# Patient Record
Sex: Male | Born: 1960
Health system: Southern US, Community
[De-identification: ages and names within clinical notes are randomized; demographics above are authoritative.]

## PROBLEM LIST (undated history)

## (undated) DIAGNOSIS — M1990 Unspecified osteoarthritis, unspecified site: Secondary | ICD-10-CM

## (undated) DIAGNOSIS — K219 Gastro-esophageal reflux disease without esophagitis: Secondary | ICD-10-CM

## (undated) DIAGNOSIS — I10 Essential (primary) hypertension: Secondary | ICD-10-CM

## (undated) DIAGNOSIS — E119 Type 2 diabetes mellitus without complications: Secondary | ICD-10-CM

## (undated) DIAGNOSIS — S2239XA Fracture of one rib, unspecified side, initial encounter for closed fracture: Secondary | ICD-10-CM

## (undated) HISTORY — PX: TONSILLECTOMY: SUR1361

## (undated) HISTORY — PX: JOINT REPLACEMENT: SHX530

## (undated) HISTORY — PX: KNEE ARTHROSCOPY: SHX127

## (undated) HISTORY — PX: BACK SURGERY: SHX140

## (undated) HISTORY — PX: OTHER SURGICAL HISTORY: SHX169

---

## 2010-04-25 ENCOUNTER — Ambulatory Visit: Payer: Self-pay | Admitting: Gastroenterology

## 2010-04-26 LAB — PATHOLOGY REPORT

## 2011-03-26 ENCOUNTER — Ambulatory Visit: Payer: Self-pay | Admitting: Orthopedic Surgery

## 2011-04-16 ENCOUNTER — Ambulatory Visit: Payer: Self-pay | Admitting: Orthopedic Surgery

## 2011-04-19 ENCOUNTER — Ambulatory Visit: Payer: Self-pay | Admitting: Orthopedic Surgery

## 2014-09-20 ENCOUNTER — Ambulatory Visit: Payer: Self-pay

## 2014-11-17 ENCOUNTER — Ambulatory Visit: Payer: Self-pay | Admitting: Family Medicine

## 2015-07-18 ENCOUNTER — Other Ambulatory Visit: Payer: Self-pay | Admitting: Family Medicine

## 2015-10-03 ENCOUNTER — Ambulatory Visit
Admission: EM | Admit: 2015-10-03 | Discharge: 2015-10-03 | Disposition: A | Discharge status: Home / Self Care | Payer: 59 | Attending: Family Medicine | Admitting: Family Medicine

## 2015-10-03 ENCOUNTER — Encounter: Payer: Self-pay | Admitting: Emergency Medicine

## 2015-10-03 DIAGNOSIS — J0110 Acute frontal sinusitis, unspecified: Secondary | ICD-10-CM

## 2015-10-03 DIAGNOSIS — R030 Elevated blood-pressure reading, without diagnosis of hypertension: Secondary | ICD-10-CM

## 2015-10-03 DIAGNOSIS — IMO0001 Reserved for inherently not codable concepts without codable children: Secondary | ICD-10-CM

## 2015-10-03 MED ORDER — AZITHROMYCIN 250 MG PO TABS: ORAL_TABLET | ORAL | Status: DC

## 2015-10-03 NOTE — ED Provider Notes (Signed)
CSN: [id number]     Arrival date & time 10/03/15  0854 History   First MD Initiated Contact with Patient 10/03/15 [phone]     Chief Complaint  Patient presents with  . Facial Pain  . Nasal Congestion   (Consider location/radiation/quality/duration/timing/severity/associated sxs/prior Treatment) Patient is a 55 y.o. male presenting with URI. The history is provided by the patient.  URI Presenting symptoms: congestion, cough, facial pain and fatigue   Severity:  Moderate Onset quality:  Sudden Duration:  4 days Timing:  Constant Progression:  Worsening Chronicity:  New Worsened by:  Nothing tried Ineffective treatments:  None tried Associated symptoms: headaches and sinus pain   Associated symptoms: no wheezing     History reviewed. No pertinent past medical history. History reviewed. No pertinent past surgical history. History reviewed. No pertinent family history. Social History  Substance Use Topics  . Smoking status: Never Smoker   . Smokeless tobacco: None  . Alcohol Use: Yes    Review of Systems  Constitutional: Positive for fatigue.  HENT: Positive for congestion.   Respiratory: Positive for cough. Negative for wheezing.   Neurological: Positive for headaches.    Allergies  Penicillins  Home Medications   Prior to Admission medications   Medication Sig Start Date End Date Taking? Authorizing Provider  azithromycin (ZITHROMAX Z-PAK) 250 MG tablet 2 tabs po once day 1, then 1 tab po qd for next 4 days 10/03/15   Norval Gable, MD   Meds Ordered and Administered this Visit  Medications - No data to display  BP 195/103 mmHg  Pulse 77  Temp(Src) 97.9 F (36.6 C) (Oral)  Resp 16  Ht 6' (1.829 m)  Wt 228 lb (103.42 kg)  BMI 30.92 kg/m2  SpO2 98% No data found.   Physical Exam  Constitutional: He appears well-developed and well-nourished. No distress.  HENT:  Head: Normocephalic and atraumatic.  Right Ear: Tympanic membrane, external ear and ear canal  normal.  Left Ear: Tympanic membrane, external ear and ear canal normal.  Nose: Nose normal.  Mouth/Throat: Uvula is midline, oropharynx is clear and moist and mucous membranes are normal. No oropharyngeal exudate or tonsillar abscesses.  Eyes: Conjunctivae and EOM are normal. Pupils are equal, round, and reactive to light. Right eye exhibits no discharge. Left eye exhibits no discharge. No scleral icterus.  Neck: Normal range of motion. Neck supple. No tracheal deviation present. No thyromegaly present.  Cardiovascular: Normal rate, regular rhythm and normal heart sounds.   Pulmonary/Chest: Effort normal and breath sounds normal. No stridor. No respiratory distress. He has no wheezes. He has no rales. He exhibits no tenderness.  Lymphadenopathy:    He has no cervical adenopathy.  Neurological: He is alert.  Skin: Skin is warm and dry. No rash noted. He is not diaphoretic.  Nursing note and vitals reviewed.   ED Course  Procedures (including critical care time)  Labs Review Labs Reviewed - No data to display  Imaging Review No results found.   Visual Acuity Review  Right Eye Distance:   Left Eye Distance:   Bilateral Distance:    Right Eye Near:   Left Eye Near:    Bilateral Near:         MDM   1. Acute frontal sinusitis, recurrence not specified   2. Blood pressure elevated     Discharge Medication List as of 10/03/2015 10:16 AM    START taking these medications   Details  azithromycin (ZITHROMAX Z-PAK) 250 MG tablet 2 tabs  po once day 1, then 1 tab po qd for next 4 days, Normal        1. diagnoses reviewed with patient 2. rx as per orders above; reviewed possible side effects, interactions, risks and benefits   3.Discussed elevated blood pressure and acute treatment here with medication to bring blood pressure down, however patient refuses and states he is  "working on my blood pressure with diet and exercise" and that he'll get back to his PCP to address  this; explained risks of having this high of blood pressure including MI and stroke and patient verbalizes understanding of these risks, but still refuses medication at this time.   4. Supportive treatment for sinusitis with otc flonase 5. Follow-up prn if symptoms worsen or don't improve    Norval Gable, MD 10/03/15 1030

## 2015-10-03 NOTE — ED Notes (Signed)
Patient dizziness, sinus congestion, sinus pressure and HAs for the past 2 days.

## 2015-12-16 ENCOUNTER — Other Ambulatory Visit: Payer: Self-pay | Admitting: Neurology

## 2015-12-16 DIAGNOSIS — R42 Dizziness and giddiness: Secondary | ICD-10-CM

## 2016-01-04 ENCOUNTER — Ambulatory Visit
Admission: RE | Admit: 2016-01-04 | Discharge: 2016-01-04 | Disposition: A | Discharge status: Home / Self Care | Payer: 59 | Source: Ambulatory Visit | Attending: Neurology | Admitting: Neurology

## 2016-01-04 DIAGNOSIS — R42 Dizziness and giddiness: Secondary | ICD-10-CM | POA: Insufficient documentation

## 2016-01-04 MED ORDER — GADOBENATE DIMEGLUMINE 529 MG/ML IV SOLN
20.0000 mL | Freq: Once | INTRAVENOUS | Status: AC | PRN
  Administered 2016-01-04: 20 mL via INTRAVENOUS

## 2016-02-08 ENCOUNTER — Encounter: Payer: Self-pay | Admitting: *Deleted

## 2016-02-09 ENCOUNTER — Ambulatory Visit: Payer: 59 | Admitting: Anesthesiology

## 2016-02-09 ENCOUNTER — Ambulatory Visit
Admission: RE | Admit: 2016-02-09 | Discharge: 2016-02-09 | Disposition: A | Discharge status: Home / Self Care | Payer: 59 | Source: Ambulatory Visit | Attending: Gastroenterology | Admitting: Gastroenterology

## 2016-02-09 ENCOUNTER — Encounter
Admission: RE | Disposition: A | Discharge status: Home / Self Care | Payer: Self-pay | Source: Ambulatory Visit | Attending: Gastroenterology

## 2016-02-09 ENCOUNTER — Encounter: Payer: Self-pay | Admitting: *Deleted

## 2016-02-09 DIAGNOSIS — K319 Disease of stomach and duodenum, unspecified: Secondary | ICD-10-CM | POA: Insufficient documentation

## 2016-02-09 DIAGNOSIS — D125 Benign neoplasm of sigmoid colon: Secondary | ICD-10-CM | POA: Diagnosis not present

## 2016-02-09 DIAGNOSIS — K2960 Other gastritis without bleeding: Secondary | ICD-10-CM | POA: Diagnosis not present

## 2016-02-09 DIAGNOSIS — I10 Essential (primary) hypertension: Secondary | ICD-10-CM | POA: Insufficient documentation

## 2016-02-09 DIAGNOSIS — K219 Gastro-esophageal reflux disease without esophagitis: Secondary | ICD-10-CM | POA: Diagnosis not present

## 2016-02-09 DIAGNOSIS — K259 Gastric ulcer, unspecified as acute or chronic, without hemorrhage or perforation: Secondary | ICD-10-CM | POA: Diagnosis present

## 2016-02-09 DIAGNOSIS — Z1211 Encounter for screening for malignant neoplasm of colon: Secondary | ICD-10-CM | POA: Insufficient documentation

## 2016-02-09 DIAGNOSIS — Z9660 Presence of unspecified orthopedic joint implant: Secondary | ICD-10-CM | POA: Diagnosis not present

## 2016-02-09 DIAGNOSIS — Z79899 Other long term (current) drug therapy: Secondary | ICD-10-CM | POA: Diagnosis not present

## 2016-02-09 HISTORY — PX: COLONOSCOPY WITH PROPOFOL: SHX5780

## 2016-02-09 HISTORY — DX: Gastro-esophageal reflux disease without esophagitis: K21.9

## 2016-02-09 HISTORY — PX: ESOPHAGOGASTRODUODENOSCOPY (EGD) WITH PROPOFOL: SHX5813

## 2016-02-09 HISTORY — DX: Essential (primary) hypertension: I10

## 2016-02-09 SURGERY — COLONOSCOPY WITH PROPOFOL
Anesthesia: General

## 2016-02-09 MED ORDER — PROPOFOL 500 MG/50ML IV EMUL
INTRAVENOUS | Status: DC | PRN
  Administered 2016-02-09: 120 ug/kg/min via INTRAVENOUS

## 2016-02-09 MED ORDER — SODIUM CHLORIDE 0.9 % IV SOLN
INTRAVENOUS | Status: DC
  Administered 2016-02-09: 08:00:00 via INTRAVENOUS

## 2016-02-09 MED ORDER — SODIUM CHLORIDE 0.9 % IV SOLN: INTRAVENOUS | Status: DC

## 2016-02-09 MED ORDER — GLYCOPYRROLATE 0.2 MG/ML IJ SOLN
INTRAMUSCULAR | Status: DC | PRN
  Administered 2016-02-09: 0.2 mg via INTRAVENOUS

## 2016-02-09 MED ORDER — PROPOFOL 10 MG/ML IV BOLUS
INTRAVENOUS | Status: DC | PRN
  Administered 2016-02-09: 100 mg via INTRAVENOUS

## 2016-02-09 MED ORDER — LIDOCAINE HCL (CARDIAC) 20 MG/ML IV SOLN
INTRAVENOUS | Status: DC | PRN
  Administered 2016-02-09: 50 mg via INTRAVENOUS

## 2016-02-09 NOTE — Anesthesia Postprocedure Evaluation (Signed)
Anesthesia Post Note  Patient: Jerry Morgan  Procedure(s) Performed: Procedure(s) (LRB): COLONOSCOPY WITH PROPOFOL (N/A) ESOPHAGOGASTRODUODENOSCOPY (EGD) WITH PROPOFOL (N/A)  Patient location during evaluation: Endoscopy Anesthesia Type: General Level of consciousness: awake and alert Pain management: pain level controlled Vital Signs Assessment: post-procedure vital signs reviewed and stable Respiratory status: spontaneous breathing, nonlabored ventilation, respiratory function stable and patient connected to nasal cannula oxygen Cardiovascular status: blood pressure returned to baseline and stable Postop Assessment: no signs of nausea or vomiting Anesthetic complications: no    Last Vitals:  Filed Vitals:   02/09/16 0900 02/09/16 0910  BP: 140/91 159/84  Pulse: 71 65  Temp:    Resp: 16 15    Last Pain: There were no vitals filed for this visit.               Martha Clan

## 2016-02-09 NOTE — Transfer of Care (Signed)
Immediate Anesthesia Transfer of Care Note  Patient: Jerry Morgan  Procedure(s) Performed: Procedure(s): COLONOSCOPY WITH PROPOFOL (N/A) ESOPHAGOGASTRODUODENOSCOPY (EGD) WITH PROPOFOL (N/A)  Patient Location: Endoscopy Unit  Anesthesia Type:General  Level of Consciousness: awake  Airway & Oxygen Therapy: Patient Spontanous Breathing  Post-op Assessment: Report given to RN  Post vital signs: Reviewed  Last Vitals:  Filed Vitals:   02/09/16 0728 02/09/16 0849  BP: 192/97 100/85  Pulse: 74 72  Temp: 36.8 C 37 C  Resp: 18 18    Last Pain: There were no vitals filed for this visit.       Complications: No apparent anesthesia complications

## 2016-02-09 NOTE — Op Note (Signed)
[facility] Gastroenterology Patient Name: Jerry Morgan Procedure Date: 02/09/2016 8:21 AM MRN: [id number] Account #: [id number] Date of Birth: 1960-10-06 Admit Type: Outpatient Age: 55 Room: [facility] ENDO ROOM 3 Gender: Male Note Status: Finalized Procedure:            Colonoscopy Indications:          Personal history of colonic polyps Providers:            Lupita Dawn. Candace Cruise, MD Referring MD:         Forest Gleason Md, MD (Referring MD) Medicines:            Monitored Anesthesia Care Complications:        No immediate complications. Procedure:            Pre-Anesthesia Assessment:                       - Prior to the procedure, a History and Physical was                        performed, and patient medications, allergies and                        sensitivities were reviewed. The patient's tolerance of                        previous anesthesia was reviewed.                       - The risks and benefits of the procedure and the                        sedation options and risks were discussed with the                        patient. All questions were answered and informed                        consent was obtained.                       - After reviewing the risks and benefits, the patient                        was deemed in satisfactory condition to undergo the                        procedure.                       After obtaining informed consent, the colonoscope was                        passed under direct vision. Throughout the procedure,                        the patient's blood pressure, pulse, and oxygen                        saturations were monitored continuously. The  Colonoscope was introduced through the anus and                        advanced to the the cecum, identified by appendiceal                        orifice and ileocecal valve. The colonoscopy was                        performed without difficulty. The patient tolerated  the                        procedure well. The quality of the bowel preparation                        was good. Findings:      Two sessile polyps were found in the distal sigmoid colon. The polyps       were diminutive in size. These polyps were removed with a cold snare.       Resection and retrieval were complete.      The exam was otherwise without abnormality. Impression:           - Two diminutive polyps in the distal sigmoid colon,                        removed with a cold snare. Resected and retrieved.                       - The examination was otherwise normal. Recommendation:       - Discharge patient to home.                       - Repeat colonoscopy in 5 years for surveillance based                        on pathology results.                       - The findings and recommendations were discussed with                        the patient. Procedure Code(s):    --- Professional ---                       [phone], Colonoscopy, flexible; with removal of tumor(s),                        polyp(s), or other lesion(s) by snare technique Diagnosis Code(s):    --- Professional ---                       D12.5, Benign neoplasm of sigmoid colon                       Z86.010, Personal history of colonic polyps CPT copyright 2016 American Medical Association. All rights reserved. The codes documented in this report are preliminary and upon coder review may  be revised to meet current compliance requirements. Hulen Luster, MD 02/09/2016 8:46:45 AM This report has been signed electronically. Number of Addenda: 0 Note Initiated On: 02/09/2016 8:21 AM  Scope Withdrawal Time: 0 hours 7 minutes 32 seconds  Total Procedure Duration: 0 hours 10 minutes 5 seconds       [facility]

## 2016-02-09 NOTE — H&P (Signed)
    Primary Care Physician:  No PCP Per Patient Primary Gastroenterologist:  Dr. Candace Cruise  Pre-Procedure History & Physical: HPI:  Jerry Morgan is a 55 y.o. male is here for an EGD/colonoscopy.   Past Medical History  Diagnosis Date  . GERD (gastroesophageal reflux disease)   . Hypertension     Past Surgical History  Procedure Laterality Date  . Joint replacement    . Knee arthroscopy      Prior to Admission medications   Medication Sig Start Date End Date Taking? Authorizing Provider  pantoprazole (PROTONIX) 40 MG tablet Take 40 mg by mouth 2 (two) times daily.   Yes Historical Provider, MD  azithromycin (ZITHROMAX Z-PAK) 250 MG tablet 2 tabs po once day 1, then 1 tab po qd for next 4 days Patient not taking: Reported on 02/09/2016 10/03/15   Norval Gable, MD    Allergies as of 01/30/2016 - Review Complete 10/03/2015  Allergen Reaction Noted  . Penicillins Hives 10/03/2015    History reviewed. No pertinent family history.  Social History   Social History  . Marital Status: Married    Spouse Name: N/A  . Number of Children: N/A  . Years of Education: N/A   Occupational History  . Not on file.   Social History Main Topics  . Smoking status: Never Smoker   . Smokeless tobacco: Never Used  . Alcohol Use: Yes  . Drug Use: No  . Sexual Activity: Not on file   Other Topics Concern  . Not on file   Social History Narrative    Review of Systems: See HPI, otherwise negative ROS  Physical Exam: BP 192/97 mmHg  Pulse 74  Temp(Src) 98.2 F (36.8 C) (Tympanic)  Resp 18  Ht 6' (1.829 m)  Wt 228 lb (103.42 kg)  BMI 30.92 kg/m2  SpO2 99% General:   Alert,  pleasant and cooperative in NAD Head:  Normocephalic and atraumatic. Neck:  Supple; no masses or thyromegaly. Lungs:  Clear throughout to auscultation.    Heart:  Regular rate and rhythm. Abdomen:  Soft, nontender and nondistended. Normal bowel sounds, without guarding, and without rebound.    Neurologic:  Alert and  oriented x4;  grossly normal neurologically.  Impression/Plan: Jerry Morgan is here for an EGD/colonoscopy to be performed for personal hx of colon polyps/hx of gastric ulcer  Risks, benefits, limitations, and alternatives regarding  {EGD/colonoscopy have been reviewed with the patient.  Questions have been answered.  All parties agreeable.   Phaedra Colgate, Lupita Dawn, MD  02/09/2016, 8:17 AM

## 2016-02-09 NOTE — Op Note (Signed)
[facility] Gastroenterology Patient Name: Jerry Morgan Procedure Date: 02/09/2016 8:21 AM MRN: [id number] Account #: [id number] Date of Birth: 02-11-1961 Admit Type: Outpatient Age: 55 Room: [facility] ENDO ROOM 3 Gender: Male Note Status: Finalized Procedure:            Upper GI endoscopy Indications:          F/U of gastric ulcer Providers:            Lupita Dawn. Candace Cruise, MD Referring MD:         Forest Gleason Md, MD (Referring MD) Medicines:            Monitored Anesthesia Care Complications:        No immediate complications. Procedure:            Pre-Anesthesia Assessment:                       - Prior to the procedure, a History and Physical was                        performed, and patient medications, allergies and                        sensitivities were reviewed. The patient's tolerance of                        previous anesthesia was reviewed.                       - The risks and benefits of the procedure and the                        sedation options and risks were discussed with the                        patient. All questions were answered and informed                        consent was obtained.                       - After reviewing the risks and benefits, the patient                        was deemed in satisfactory condition to undergo the                        procedure.                       After obtaining informed consent, the endoscope was                        passed under direct vision. Throughout the procedure,                        the patient's blood pressure, pulse, and oxygen                        saturations were monitored continuously. The Endoscope  was introduced through the mouth, and advanced to the                        second part of duodenum. The upper GI endoscopy was                        accomplished without difficulty. The patient tolerated                        the procedure well. Findings:      The  examined esophagus was normal.      Multiple localized medium erosions were found in the gastric antrum.       Biopsies were taken with a cold forceps for Helicobacter pylori testing.      The exam was otherwise without abnormality.      The examined duodenum was normal. Impression:           - Normal esophagus.                       - Erosive gastropathy. Biopsied.                       - The examination was otherwise normal.                       - Normal examined duodenum. Recommendation:       - Discharge patient to home.                       - Observe patient's clinical course.                       - The findings and recommendations were discussed with                        the patient.                       - Continue present medications. Procedure Code(s):    --- Professional ---                       [phone], Esophagogastroduodenoscopy, flexible, transoral;                        with biopsy, single or multiple Diagnosis Code(s):    --- Professional ---                       K31.89, Other diseases of stomach and duodenum CPT copyright 2016 American Medical Association. All rights reserved. The codes documented in this report are preliminary and upon coder review may  be revised to meet current compliance requirements. Hulen Luster, MD 02/09/2016 8:33:14 AM This report has been signed electronically. Number of Addenda: 0 Note Initiated On: 02/09/2016 8:21 AM      [facility]

## 2016-02-09 NOTE — Anesthesia Preprocedure Evaluation (Addendum)
Anesthesia Evaluation  Patient identified by MRN, date of birth, ID band Patient awake    Reviewed: Allergy & Precautions, H&P , NPO status , Patient's Chart, lab work & pertinent test results, reviewed documented beta blocker date and time   History of Anesthesia Complications Negative for: history of anesthetic complications  Airway Mallampati: II  TM Distance: >3 FB Neck ROM: full    Dental no notable dental hx. (+) Teeth Intact, Implants   Pulmonary neg pulmonary ROS,    Pulmonary exam normal breath sounds clear to auscultation       Cardiovascular Exercise Tolerance: Good hypertension, (-) angina(-) CAD, (-) Past MI, (-) Cardiac Stents and (-) CABG Normal cardiovascular exam(-) dysrhythmias (-) Valvular Problems/Murmurs Rhythm:regular Rate:Normal     Neuro/Psych negative neurological ROS  negative psych ROS   GI/Hepatic Neg liver ROS, GERD  ,  Endo/Other  negative endocrine ROS  Renal/GU negative Renal ROS  negative genitourinary   Musculoskeletal   Abdominal   Peds  Hematology negative hematology ROS (+)   Anesthesia Other Findings Past Medical History:   GERD (gastroesophageal reflux disease)                       Hypertension                                                 Reproductive/Obstetrics negative OB ROS                            Anesthesia Physical Anesthesia Plan  ASA: II  Anesthesia Plan: General   Post-op Pain Management:    Induction:   Airway Management Planned:   Additional Equipment:   Intra-op Plan:   Post-operative Plan:   Informed Consent: I have reviewed the patients History and Physical, chart, labs and discussed the procedure including the risks, benefits and alternatives for the proposed anesthesia with the patient or authorized representative who has indicated his/her understanding and acceptance.   Dental Advisory Given  Plan Discussed  with: Anesthesiologist, CRNA and Surgeon  Anesthesia Plan Comments:         Anesthesia Quick Evaluation

## 2016-02-10 LAB — SURGICAL PATHOLOGY

## 2016-02-11 ENCOUNTER — Encounter: Payer: Self-pay | Admitting: Gastroenterology

## 2016-08-20 ENCOUNTER — Ambulatory Visit
Admission: EM | Admit: 2016-08-20 | Discharge: 2016-08-20 | Disposition: A | Discharge status: Home / Self Care | Payer: 59 | Attending: Family Medicine | Admitting: Family Medicine

## 2016-08-20 DIAGNOSIS — J0100 Acute maxillary sinusitis, unspecified: Secondary | ICD-10-CM

## 2016-08-20 DIAGNOSIS — I10 Essential (primary) hypertension: Secondary | ICD-10-CM

## 2016-08-20 DIAGNOSIS — E119 Type 2 diabetes mellitus without complications: Secondary | ICD-10-CM | POA: Diagnosis not present

## 2016-08-20 LAB — BASIC METABOLIC PANEL
ANION GAP: 11 (ref 5–15)
BUN: 19 mg/dL (ref 6–20)
CALCIUM: 9.2 mg/dL (ref 8.9–10.3)
CO2: 22 mmol/L (ref 22–32)
CREATININE: 0.57 mg/dL — AB (ref 0.61–1.24)
Chloride: 98 mmol/L — ABNORMAL LOW (ref 101–111)
GFR calc non Af Amer: >60 mL/min (ref >60)
Glucose, Bld: 316 mg/dL — ABNORMAL HIGH (ref 65–99)
Potassium: 4.4 mmol/L (ref 3.5–5.1)
SODIUM: 131 mmol/L — AB (ref 135–145)

## 2016-08-20 MED ORDER — LISINOPRIL 10 MG PO TABS: 10.0000 mg | ORAL_TABLET | Freq: Every day | ORAL | 0 refills | Status: DC

## 2016-08-20 MED ORDER — CLONIDINE HCL 0.2 MG PO TABS
0.2000 mg | ORAL_TABLET | Freq: Once | ORAL | Status: AC
  Administered 2016-08-20: 0.2 mg via ORAL

## 2016-08-20 MED ORDER — AZITHROMYCIN 250 MG PO TABS: ORAL_TABLET | ORAL | 0 refills | Status: DC

## 2016-08-20 NOTE — Discharge Instructions (Signed)
Establish care and follow up with a Primary Care Provider

## 2016-08-20 NOTE — ED Triage Notes (Signed)
Patient complains of high blood pressure x years. Patient states that he has tried several medications in the past but had adverse side effects. Patient reports that he started having cold symptoms over 1 week ago with sinus pain and pressure, cough, congestion.

## 2016-08-20 NOTE — ED Provider Notes (Signed)
[facility]    CSN: [id number] Arrival date & time: 08/20/16  0813     History   Chief Complaint Chief Complaint  Patient presents with  . Hypertension  . URI    HPI Jerry Morgan is a 55 y.o. male.   55 yo male with a c/o high blood pressure. States he used to take medication for hypertension years ago but medications "didn't agree with me". Patient states he's been trying to work on diet and exercise but blood pressures recently have been higher. Denies any fevers, chills, chest pains, shortness of breath, vision changes, headache.   Also c/o 1 week of sinus congestion and sinus pressure.    The history is provided by the patient.  Hypertension   URI    Past Medical History:  Diagnosis Date  . GERD (gastroesophageal reflux disease)   . Hypertension     There are no active problems to display for this patient.   Past Surgical History:  Procedure Laterality Date  . COLONOSCOPY WITH PROPOFOL N/A 02/09/2016   Procedure: COLONOSCOPY WITH PROPOFOL;  Surgeon: Hulen Luster, MD;  Location: [facility] ENDOSCOPY;  Service: Gastroenterology;  Laterality: N/A;  . ESOPHAGOGASTRODUODENOSCOPY (EGD) WITH PROPOFOL N/A 02/09/2016   Procedure: ESOPHAGOGASTRODUODENOSCOPY (EGD) WITH PROPOFOL;  Surgeon: Hulen Luster, MD;  Location[facility] ENDOSCOPY;  Service: Gastroenterology;  Laterality: N/A;  . JOINT REPLACEMENT    . KNEE ARTHROSCOPY         Home Medications    Prior to Admission medications   Medication Sig Start Date End Date Taking? Authorizing Provider  azithromycin (ZITHROMAX Z-PAK) 250 MG tablet 2 tabs po once day 1, then 1 tab po qd for next 4 days 08/20/16   Norval Gable, MD  lisinopril (PRINIVIL,ZESTRIL) 10 MG tablet Take 1 tablet (10 mg total) by mouth daily. 08/20/16   Norval Gable, MD  pantoprazole (PROTONIX) 40 MG tablet Take 40 mg by mouth 2 (two) times daily.    Historical Provider, MD    Family History History reviewed. No pertinent family  history.  Social History Social History  Substance Use Topics  . Smoking status: Never Smoker  . Smokeless tobacco: Never Used  . Alcohol use Yes     Allergies   Penicillins   Review of Systems Review of Systems   Physical Exam Triage Vital Signs ED Triage Vitals  Enc Vitals Group     BP 08/20/16 0837 (!) 201/111     Pulse Rate 08/20/16 0837 75     Resp 08/20/16 0837 15     Temp 08/20/16 0837 97.6 F (36.4 C)     Temp Source 08/20/16 0837 Oral     SpO2 08/20/16 0837 99 %     Weight 08/20/16 0834 235 lb (106.6 kg)     Height 08/20/16 0834 6' (1.829 m)     Head Circumference --      Peak Flow --      Pain Score 08/20/16 0835 0     Pain Loc --      Pain Edu? --      Excl. in [place]? --    No data found.   Updated Vital Signs BP (!) 190/108   Pulse 75   Temp 97.6 F (36.4 C) (Oral)   Resp 15   Ht 6' (1.829 m)   Wt 235 lb (106.6 kg)   SpO2 99%   BMI 31.87 kg/m   Visual Acuity Right Eye Distance:   Left Eye Distance:  Bilateral Distance:    Right Eye Near:   Left Eye Near:    Bilateral Near:     Physical Exam  Constitutional: He appears well-developed and well-nourished. No distress.  HENT:  Head: Normocephalic and atraumatic.  Right Ear: Tympanic membrane, external ear and ear canal normal.  Left Ear: Tympanic membrane, external ear and ear canal normal.  Nose: Right sinus exhibits maxillary sinus tenderness. Left sinus exhibits maxillary sinus tenderness.  Mouth/Throat: Uvula is midline, oropharynx is clear and moist and mucous membranes are normal. No oropharyngeal exudate or tonsillar abscesses.  Eyes: Conjunctivae and EOM are normal. Pupils are equal, round, and reactive to light. Right eye exhibits no discharge. Left eye exhibits no discharge. No scleral icterus.  Neck: Normal range of motion. Neck supple. No tracheal deviation present. No thyromegaly present.  Cardiovascular: Normal rate, regular rhythm and normal heart sounds.    Pulmonary/Chest: Effort normal and breath sounds normal. No stridor. No respiratory distress. He has no wheezes. He has no rales. He exhibits no tenderness.  Lymphadenopathy:    He has no cervical adenopathy.  Neurological: He is alert.  Skin: Skin is warm and dry. No rash noted. He is not diaphoretic.  Nursing note and vitals reviewed.    UC Treatments / Results  Labs (all labs ordered are listed, but only abnormal results are displayed) Labs Reviewed  BASIC METABOLIC PANEL - Abnormal; Notable for the following:       Result Value   Sodium 131 (*)    Chloride 98 (*)    Glucose, Bld 316 (*)    Creatinine, Ser 0.57 (*)    All other components within normal limits    EKG  EKG Interpretation None       Radiology No results found.  Procedures Procedures (including critical care time)  Medications Ordered in UC Medications  cloNIDine (CATAPRES) tablet 0.2 mg (0.2 mg Oral Given 08/20/16 0848)     Initial Impression / Assessment and Plan / UC Course  I have reviewed the triage vital signs and the nursing notes.  Pertinent labs & imaging results that were available during my care of the patient were reviewed by me and considered in my medical decision making (see chart for details).  Clinical Course       Final Clinical Impressions(s) / UC Diagnoses   Final diagnoses:  Hypertension, unspecified type  Newly diagnosed diabetes ([place])  Acute maxillary sinusitis, recurrence not specified    New Prescriptions Discharge Medication List as of 08/20/2016 10:03 AM    START taking these medications   Details  lisinopril (PRINIVIL,ZESTRIL) 10 MG tablet Take 1 tablet (10 mg total) by mouth daily., Starting Mon 08/20/2016, Normal       1. Lab results and diagnosis reviewed with patient; discussed with patient will need close follow up and diabetes medication, education and monitoring 2. rx as per orders above; reviewed possible side effects, interactions, risks and  benefits  3. Recommend supportive treatment with diet and exercise   4. Follow-up tomorrow to establish care with new PCP   Norval Gable, MD 08/20/16 1158

## 2016-08-21 ENCOUNTER — Ambulatory Visit (INDEPENDENT_AMBULATORY_CARE_PROVIDER_SITE_OTHER): Payer: 59 | Admitting: Family Medicine

## 2016-08-21 ENCOUNTER — Encounter: Payer: Self-pay | Admitting: Family Medicine

## 2016-08-21 VITALS — BP 210/118 | HR 80 | Ht 72.0 in | Wt 240.0 lb

## 2016-08-21 DIAGNOSIS — I10 Essential (primary) hypertension: Secondary | ICD-10-CM | POA: Diagnosis not present

## 2016-08-21 DIAGNOSIS — E119 Type 2 diabetes mellitus without complications: Secondary | ICD-10-CM | POA: Diagnosis not present

## 2016-08-21 LAB — POCT CBG (FASTING - GLUCOSE)-MANUAL ENTRY: Glucose Fasting, POC: 346 mg/dL — AB (ref 70–99)

## 2016-08-21 MED ORDER — HYDRALAZINE HCL 10 MG PO TABS: 10.0000 mg | ORAL_TABLET | Freq: Three times a day (TID) | ORAL | 1 refills | Status: DC

## 2016-08-21 NOTE — Progress Notes (Signed)
Name: Jerry Morgan   MRN: [id number]    DOB: [birth year]   Date:08/21/2016       Progress Note  Subjective  Chief Complaint  Chief Complaint  Patient presents with  . Establish Care  . Follow-up    urgent care- had elevated B/P and BS- started on Lisinopril x 2 days    Hypertension  This is a new problem. The current episode started more than 1 year ago. The problem has been waxing and waning since onset. The problem is uncontrolled. Pertinent negatives include no anxiety, blurred vision, chest pain, headaches, malaise/fatigue, neck pain, orthopnea, palpitations, peripheral edema, PND, shortness of breath or sweats. There are no associated agents to hypertension. Risk factors for coronary artery disease include diabetes mellitus, dyslipidemia, male gender and obesity. Past treatments include ACE inhibitors. The current treatment provides moderate improvement. There are no compliance problems.  There is no history of angina, kidney disease, CAD/MI, CVA, heart failure, left ventricular hypertrophy, PVD, renovascular disease or retinopathy. There is no history of chronic renal disease or a hypertension causing med.    No problem-specific Assessment & Plan notes found for this encounter.   Past Medical History:  Diagnosis Date  . GERD (gastroesophageal reflux disease)   . Hypertension     Past Surgical History:  Procedure Laterality Date  . COLONOSCOPY WITH PROPOFOL N/A 02/09/2016   Procedure: COLONOSCOPY WITH PROPOFOL;  Surgeon: Hulen Luster, MD;  Location: [facility] ENDOSCOPY;  Service: Gastroenterology;  Laterality: N/A;  . ESOPHAGOGASTRODUODENOSCOPY (EGD) WITH PROPOFOL N/A 02/09/2016   Procedure: ESOPHAGOGASTRODUODENOSCOPY (EGD) WITH PROPOFOL;  Surgeon: Hulen Luster, MD;  Location: [facility] ENDOSCOPY;  Service: Gastroenterology;  Laterality: N/A;  . JOINT REPLACEMENT    . KNEE ARTHROSCOPY      Family History  Problem Relation Age of Onset  . Diabetes Mother   . Hypertension Mother   .  Heart disease Father   . Hypertension Father     Social History   Social History  . Marital status: Married    Spouse name: N/A  . Number of children: N/A  . Years of education: N/A   Occupational History  . Not on file.   Social History Main Topics  . Smoking status: Never Smoker  . Smokeless tobacco: Never Used  . Alcohol use Yes  . Drug use: No  . Sexual activity: Not Currently   Other Topics Concern  . Not on file   Social History Narrative  . No narrative on file    Allergies  Allergen Reactions  . Penicillins Hives     Review of Systems  Constitutional: Negative for chills, fever, malaise/fatigue and weight loss.  HENT: Negative for ear discharge, ear pain and sore throat.   Eyes: Negative for blurred vision.  Respiratory: Negative for cough, sputum production, shortness of breath and wheezing.   Cardiovascular: Negative for chest pain, palpitations, orthopnea, leg swelling and PND.  Gastrointestinal: Negative for abdominal pain, blood in stool, constipation, diarrhea, heartburn, melena and nausea.  Genitourinary: Negative for dysuria, frequency, hematuria and urgency.  Musculoskeletal: Negative for back pain, joint pain, myalgias and neck pain.  Skin: Negative for rash.  Neurological: Negative for dizziness, tingling, sensory change, focal weakness and headaches.  Endo/Heme/Allergies: Negative for environmental allergies and polydipsia. Does not bruise/bleed easily.  Psychiatric/Behavioral: Negative for depression and suicidal ideas. The patient is not nervous/anxious and does not have insomnia.      Objective  Vitals:   08/21/16 1335  BP: (!) 210/118  Pulse:  80  Weight: 240 lb (108.9 kg)  Height: 6' (1.829 m)    Physical Exam  Constitutional: He is oriented to person, place, and time and well-developed, well-nourished, and in no distress.  HENT:  Head: Normocephalic.  Right Ear: External ear normal.  Left Ear: External ear normal.  Nose: Nose  normal.  Mouth/Throat: Oropharynx is clear and moist.  Eyes: Conjunctivae and EOM are normal. Pupils are equal, round, and reactive to light. Right eye exhibits no discharge. Left eye exhibits no discharge. No scleral icterus.  Neck: Normal range of motion. Neck supple. No JVD present. No tracheal deviation present. No thyromegaly present.  Cardiovascular: Normal rate, regular rhythm, normal heart sounds and intact distal pulses.  Exam reveals no gallop and no friction rub.   No murmur heard. Pulmonary/Chest: Breath sounds normal. No respiratory distress. He has no wheezes. He has no rales.  Abdominal: Soft. Bowel sounds are normal. He exhibits no mass. There is no hepatosplenomegaly. There is no tenderness. There is no rebound, no guarding and no CVA tenderness.  Musculoskeletal: Normal range of motion. He exhibits no edema or tenderness.  Lymphadenopathy:    He has no cervical adenopathy.  Neurological: He is alert and oriented to person, place, and time. He has normal sensation, normal strength and intact cranial nerves. No cranial nerve deficit.  Skin: Skin is warm. No rash noted.  Psychiatric: Mood and affect normal.  Nursing note and vitals reviewed.     Assessment & Plan  Problem List Items Addressed This Visit    None    Visit Diagnoses    Essential hypertension    -  Primary   Relevant Medications   hydrALAZINE (APRESOLINE) 10 MG tablet   Type 2 diabetes mellitus without complication, without long-term current use of insulin (HCC)       Relevant Orders   Hemoglobin A1c   POCT CBG (Fasting - Glucose) (Completed)      Stop Lisinopril and start Hydralazine TID until check in 2 weeks  Dr. [facility]  08/21/16

## 2016-08-21 NOTE — Patient Instructions (Signed)

## 2016-08-22 ENCOUNTER — Other Ambulatory Visit: Payer: Self-pay

## 2016-08-22 LAB — HEMOGLOBIN A1C
Est. average glucose Bld gHb Est-mCnc: 275 mg/dL
Hgb A1c MFr Bld: 11.2 % — ABNORMAL HIGH (ref 4.8–5.6)

## 2016-08-22 MED ORDER — METFORMIN HCL 500 MG PO TABS: 500.0000 mg | ORAL_TABLET | Freq: Two times a day (BID) | ORAL | 1 refills | Status: DC

## 2016-08-28 ENCOUNTER — Other Ambulatory Visit: Payer: Self-pay

## 2016-08-28 DIAGNOSIS — I10 Essential (primary) hypertension: Secondary | ICD-10-CM

## 2016-08-28 MED ORDER — HYDRALAZINE HCL 10 MG PO TABS: 20.0000 mg | ORAL_TABLET | Freq: Three times a day (TID) | ORAL | 1 refills | Status: DC

## 2016-09-04 ENCOUNTER — Ambulatory Visit (INDEPENDENT_AMBULATORY_CARE_PROVIDER_SITE_OTHER): Payer: 59 | Admitting: Family Medicine

## 2016-09-04 ENCOUNTER — Encounter: Payer: Self-pay | Admitting: Family Medicine

## 2016-09-04 VITALS — BP 168/102 | HR 88 | Temp 98.7°F | Ht 72.0 in | Wt 239.0 lb

## 2016-09-04 DIAGNOSIS — E119 Type 2 diabetes mellitus without complications: Secondary | ICD-10-CM | POA: Diagnosis not present

## 2016-09-04 DIAGNOSIS — I10 Essential (primary) hypertension: Secondary | ICD-10-CM

## 2016-09-04 MED ORDER — LOSARTAN POTASSIUM 50 MG PO TABS: 50.0000 mg | ORAL_TABLET | Freq: Every day | ORAL | 1 refills | Status: DC

## 2016-09-04 MED ORDER — METFORMIN HCL 500 MG PO TABS: 500.0000 mg | ORAL_TABLET | Freq: Two times a day (BID) | ORAL | 1 refills | Status: DC

## 2016-09-04 MED ORDER — HYDRALAZINE HCL 25 MG PO TABS: 25.0000 mg | ORAL_TABLET | Freq: Two times a day (BID) | ORAL | 1 refills | Status: DC

## 2016-09-04 NOTE — Progress Notes (Signed)
Name: Jerry Morgan   MRN: [id number]    DOB: 1961/04/25   Date:09/04/2016       Progress Note  Subjective  Chief Complaint  Chief Complaint  Patient presents with  . Hypertension  . Blood Sugar Problem    Hypertension  This is a new problem. The current episode started 1 to 4 weeks ago. The problem has been gradually improving since onset. The problem is uncontrolled. Pertinent negatives include no anxiety, blurred vision, chest pain, headaches, malaise/fatigue, neck pain, orthopnea, palpitations, peripheral edema, PND, shortness of breath or sweats. There are no associated agents to hypertension. There are no known risk factors for coronary artery disease. Treatments tried: hydralzine. The current treatment provides moderate improvement. Compliance problems include medication side effects (nocturnal cough on lisinopril).  There is no history of angina, kidney disease, CAD/MI, CVA, heart failure, left ventricular hypertrophy, renovascular disease or retinopathy. There is no history of chronic renal disease or a hypertension causing med.  Diabetes  He presents for his follow-up diabetic visit. He has type 2 diabetes mellitus. His disease course has been improving. Pertinent negatives for hypoglycemia include no confusion, dizziness, headaches, mood changes, nervousness/anxiousness, pallor, sleepiness, speech difficulty or sweats. Pertinent negatives for diabetes include no blurred vision, no chest pain, no fatigue, no foot paresthesias, no foot ulcerations, no polydipsia, no polyphagia, no polyuria, no visual change, no weakness and no weight loss. Symptoms are improving. Pertinent negatives for diabetic complications include no CVA or retinopathy. Risk factors for coronary artery disease include diabetes mellitus, hypertension and male sex. Current diabetic treatment includes oral agent (monotherapy) and diet. He is compliant with treatment most of the time. His weight is stable. He is  following a generally healthy diet. He participates in exercise daily. His home blood glucose trend is decreasing steadily. An ACE inhibitor/angiotensin II receptor blocker is not being taken. He does not see a podiatrist.Eye exam is not current.    No problem-specific Assessment & Plan notes found for this encounter.   Past Medical History:  Diagnosis Date  . GERD (gastroesophageal reflux disease)   . Hypertension     Past Surgical History:  Procedure Laterality Date  . COLONOSCOPY WITH PROPOFOL N/A 02/09/2016   Procedure: COLONOSCOPY WITH PROPOFOL;  Surgeon: Hulen Luster, MD;  Location: [facility] ENDOSCOPY;  Service: Gastroenterology;  Laterality: N/A;  . ESOPHAGOGASTRODUODENOSCOPY (EGD) WITH PROPOFOL N/A 02/09/2016   Procedure: ESOPHAGOGASTRODUODENOSCOPY (EGD) WITH PROPOFOL;  Surgeon: Hulen Luster, MD;  Location: [facility] ENDOSCOPY;  Service: Gastroenterology;  Laterality: N/A;  . JOINT REPLACEMENT    . KNEE ARTHROSCOPY      Family History  Problem Relation Age of Onset  . Diabetes Mother   . Hypertension Mother   . Heart disease Father   . Hypertension Father     Social History   Social History  . Marital status: Married    Spouse name: N/A  . Number of children: N/A  . Years of education: N/A   Occupational History  . Not on file.   Social History Main Topics  . Smoking status: Never Smoker  . Smokeless tobacco: Never Used  . Alcohol use Yes  . Drug use: No  . Sexual activity: Not Currently   Other Topics Concern  . Not on file   Social History Narrative  . No narrative on file    Allergies  Allergen Reactions  . Penicillins Hives     Review of Systems  Constitutional: Negative for chills, fatigue, fever, malaise/fatigue and weight  loss.  HENT: Negative for ear discharge, ear pain and sore throat.   Eyes: Negative for blurred vision.  Respiratory: Negative for cough, sputum production, shortness of breath and wheezing.   Cardiovascular: Negative for chest pain,  palpitations, orthopnea, leg swelling and PND.  Gastrointestinal: Negative for abdominal pain, blood in stool, constipation, diarrhea, heartburn, melena and nausea.  Genitourinary: Negative for dysuria, frequency, hematuria and urgency.  Musculoskeletal: Negative for back pain, joint pain, myalgias and neck pain.  Skin: Negative for pallor and rash.  Neurological: Negative for dizziness, tingling, sensory change, focal weakness, speech difficulty, weakness and headaches.  Endo/Heme/Allergies: Negative for environmental allergies, polydipsia and polyphagia. Does not bruise/bleed easily.  Psychiatric/Behavioral: Negative for confusion, depression and suicidal ideas. The patient is not nervous/anxious and does not have insomnia.      Objective  Vitals:   09/04/16 0913  BP: (!) 168/102  Pulse: 88  Temp: 98.7 F (37.1 C)  Weight: 239 lb (108.4 kg)  Height: 6' (1.829 m)    Physical Exam  Constitutional: He is oriented to person, place, and time and well-developed, well-nourished, and in no distress.  HENT:  Head: Normocephalic.  Right Ear: External ear normal.  Left Ear: External ear normal.  Nose: Nose normal.  Mouth/Throat: Oropharynx is clear and moist.  Eyes: Conjunctivae and EOM are normal. Pupils are equal, round, and reactive to light. Right eye exhibits no discharge. Left eye exhibits no discharge. No scleral icterus.  Neck: Normal range of motion. Neck supple. No JVD present. No tracheal deviation present. No thyromegaly present.  Cardiovascular: Normal rate, regular rhythm, normal heart sounds and intact distal pulses.  Exam reveals no gallop and no friction rub.   No murmur heard. Pulmonary/Chest: Breath sounds normal. No respiratory distress. He has no wheezes. He has no rales.  Abdominal: Soft. Bowel sounds are normal. He exhibits no mass. There is no hepatosplenomegaly. There is no tenderness. There is no rebound, no guarding and no CVA tenderness.  Musculoskeletal:  Normal range of motion. He exhibits no edema or tenderness.  Lymphadenopathy:    He has no cervical adenopathy.  Neurological: He is alert and oriented to person, place, and time. He has normal sensation, normal strength, normal reflexes and intact cranial nerves. No cranial nerve deficit.  Skin: Skin is warm. No rash noted.  Psychiatric: Mood and affect normal.  Nursing note and vitals reviewed.     Assessment & Plan  Problem List Items Addressed This Visit    None    Visit Diagnoses    Essential hypertension    -  Primary   Relevant Medications   hydrALAZINE (APRESOLINE) 25 MG tablet   losartan (COZAAR) 50 MG tablet   Type 2 diabetes mellitus without complication, without long-term current use of insulin (HCC)       Relevant Medications   losartan (COZAAR) 50 MG tablet   metFORMIN (GLUCOPHAGE) 500 MG tablet   Other Relevant Orders   Hemoglobin A1c        Dr. Deanna Jones [facility]  09/04/16

## 2016-09-05 LAB — HEMOGLOBIN A1C
ESTIMATED AVERAGE GLUCOSE: 272 mg/dL
Hgb A1c MFr Bld: 11.1 % — ABNORMAL HIGH (ref 4.8–5.6)

## 2016-09-06 ENCOUNTER — Other Ambulatory Visit: Payer: Self-pay | Admitting: *Deleted

## 2016-09-06 DIAGNOSIS — E119 Type 2 diabetes mellitus without complications: Secondary | ICD-10-CM

## 2016-09-06 MED ORDER — EMPAGLIFLOZIN 10 MG PO TABS: 10.0000 mg | ORAL_TABLET | Freq: Every day | ORAL | 0 refills | Status: DC

## 2016-09-12 ENCOUNTER — Emergency Department
Admission: EM | Admit: 2016-09-12 | Discharge: 2016-09-13 | Disposition: A | Discharge status: Other Acute Inpatient Hospital | Payer: 59 | Attending: Emergency Medicine | Admitting: Emergency Medicine

## 2016-09-12 ENCOUNTER — Emergency Department: Payer: 59

## 2016-09-12 ENCOUNTER — Encounter: Payer: Self-pay | Admitting: Emergency Medicine

## 2016-09-12 DIAGNOSIS — E119 Type 2 diabetes mellitus without complications: Secondary | ICD-10-CM | POA: Insufficient documentation

## 2016-09-12 DIAGNOSIS — S299XXA Unspecified injury of thorax, initial encounter: Secondary | ICD-10-CM | POA: Diagnosis present

## 2016-09-12 DIAGNOSIS — Y929 Unspecified place or not applicable: Secondary | ICD-10-CM | POA: Diagnosis not present

## 2016-09-12 DIAGNOSIS — S2239XA Fracture of one rib, unspecified side, initial encounter for closed fracture: Secondary | ICD-10-CM

## 2016-09-12 DIAGNOSIS — X58XXXA Exposure to other specified factors, initial encounter: Secondary | ICD-10-CM | POA: Insufficient documentation

## 2016-09-12 DIAGNOSIS — Z79899 Other long term (current) drug therapy: Secondary | ICD-10-CM | POA: Insufficient documentation

## 2016-09-12 DIAGNOSIS — Y939 Activity, unspecified: Secondary | ICD-10-CM | POA: Diagnosis not present

## 2016-09-12 DIAGNOSIS — S2242XA Multiple fractures of ribs, left side, initial encounter for closed fracture: Secondary | ICD-10-CM | POA: Insufficient documentation

## 2016-09-12 DIAGNOSIS — J942 Hemothorax: Secondary | ICD-10-CM | POA: Insufficient documentation

## 2016-09-12 DIAGNOSIS — I10 Essential (primary) hypertension: Secondary | ICD-10-CM | POA: Diagnosis not present

## 2016-09-12 DIAGNOSIS — Z7984 Long term (current) use of oral hypoglycemic drugs: Secondary | ICD-10-CM | POA: Insufficient documentation

## 2016-09-12 DIAGNOSIS — Y999 Unspecified external cause status: Secondary | ICD-10-CM | POA: Diagnosis not present

## 2016-09-12 DIAGNOSIS — R079 Chest pain, unspecified: Secondary | ICD-10-CM

## 2016-09-12 HISTORY — DX: Fracture of one rib, unspecified side, initial encounter for closed fracture: S22.39XA

## 2016-09-12 LAB — BASIC METABOLIC PANEL
Anion gap: 16 — ABNORMAL HIGH (ref 5–15)
Anion gap: 13 (ref 5–15)
BUN: 16 mg/dL (ref 6–20)
BUN: 16 mg/dL (ref 6–20)
CALCIUM: 8.5 mg/dL — AB (ref 8.9–10.3)
CHLORIDE: 98 mmol/L — AB (ref 101–111)
CO2: 17 mmol/L — AB (ref 22–32)
CO2: 18 mmol/L — AB (ref 22–32)
CREATININE: 0.64 mg/dL (ref 0.61–1.24)
CREATININE: 0.57 mg/dL — AB (ref 0.61–1.24)
Calcium: 9.3 mg/dL (ref 8.9–10.3)
Chloride: 101 mmol/L (ref 101–111)
GFR calc Af Amer: >60 mL/min (ref >60)
GFR calc Af Amer: >60 mL/min (ref >60)
GFR calc non Af Amer: >60 mL/min (ref >60)
GFR calc non Af Amer: >60 mL/min (ref >60)
GLUCOSE: 293 mg/dL — AB (ref 65–99)
Glucose, Bld: 326 mg/dL — ABNORMAL HIGH (ref 65–99)
Potassium: 4.5 mmol/L (ref 3.5–5.1)
Potassium: 4 mmol/L (ref 3.5–5.1)
Sodium: 131 mmol/L — ABNORMAL LOW (ref 135–145)
Sodium: 132 mmol/L — ABNORMAL LOW (ref 135–145)

## 2016-09-12 LAB — CBC
HCT: 40.9 % (ref 40.0–52.0)
Hemoglobin: 14 g/dL (ref 13.0–18.0)
MCH: 30.3 pg (ref 26.0–34.0)
MCHC: 34.1 g/dL (ref 32.0–36.0)
MCV: 88.9 fL (ref 80.0–100.0)
PLATELETS: 200 10*3/uL (ref 150–440)
RBC: 4.61 MIL/uL (ref 4.40–5.90)
RDW: 13.4 % (ref 11.5–14.5)
WBC: 10.2 10*3/uL (ref 3.8–10.6)

## 2016-09-12 LAB — TROPONIN I: Troponin I: <0.03 ng/mL (ref <0.03)

## 2016-09-12 MED ORDER — OXYCODONE-ACETAMINOPHEN 5-325 MG PO TABS
ORAL_TABLET | ORAL | Status: AC
  Administered 2016-09-12: 1 via ORAL
  Filled 2016-09-12: qty 1

## 2016-09-12 MED ORDER — IPRATROPIUM-ALBUTEROL 0.5-2.5 (3) MG/3ML IN SOLN
3.0000 mL | Freq: Once | RESPIRATORY_TRACT | Status: AC
  Administered 2016-09-12: 3 mL via RESPIRATORY_TRACT
  Filled 2016-09-12: qty 3

## 2016-09-12 MED ORDER — ONDANSETRON HCL 4 MG/2ML IJ SOLN
4.0000 mg | Freq: Once | INTRAMUSCULAR | Status: AC
  Administered 2016-09-12: 4 mg via INTRAVENOUS
  Filled 2016-09-12: qty 2

## 2016-09-12 MED ORDER — SODIUM CHLORIDE 0.9 % IV BOLUS (SEPSIS)
1000.0000 mL | Freq: Once | INTRAVENOUS | Status: AC
  Administered 2016-09-12: 1000 mL via INTRAVENOUS

## 2016-09-12 MED ORDER — INSULIN ASPART 100 UNIT/ML ~~LOC~~ SOLN
5.0000 [IU] | Freq: Once | SUBCUTANEOUS | Status: AC
  Administered 2016-09-12: 5 [IU] via INTRAVENOUS
  Filled 2016-09-12: qty 5

## 2016-09-12 MED ORDER — DICLOFENAC SODIUM 3 % TD GEL
1.0000 | Freq: Two times a day (BID) | TRANSDERMAL | 0 refills | Status: DC | PRN
Start: 2016-09-12 — End: 2016-12-24

## 2016-09-12 MED ORDER — DIAZEPAM 2 MG PO TABS
2.0000 mg | ORAL_TABLET | Freq: Once | ORAL | Status: AC
  Administered 2016-09-12: 2 mg via ORAL
  Filled 2016-09-12: qty 1

## 2016-09-12 MED ORDER — CARISOPRODOL 350 MG PO TABS: 350.0000 mg | ORAL_TABLET | Freq: Three times a day (TID) | ORAL | 0 refills | Status: DC | PRN

## 2016-09-12 MED ORDER — MORPHINE SULFATE (PF) 4 MG/ML IV SOLN
4.0000 mg | Freq: Once | INTRAVENOUS | Status: AC
  Administered 2016-09-12: 4 mg via INTRAVENOUS
  Filled 2016-09-12: qty 1

## 2016-09-12 MED ORDER — OXYCODONE-ACETAMINOPHEN 5-325 MG PO TABS
1.0000 | ORAL_TABLET | Freq: Once | ORAL | Status: AC
  Administered 2016-09-12: 1 via ORAL

## 2016-09-12 MED ORDER — KETOROLAC TROMETHAMINE 30 MG/ML IJ SOLN
30.0000 mg | Freq: Once | INTRAMUSCULAR | Status: AC
Start: 2016-09-12 — End: 2016-09-12
  Administered 2016-09-12: 30 mg via INTRAVENOUS
  Filled 2016-09-12: qty 1

## 2016-09-12 NOTE — ED Provider Notes (Addendum)
-----------------------------------------   11:44 PM on 09/12/2016 -----------------------------------------  I went to reassess patient after sitting in signout from Dr. Dineen Kid. Patient initially presenting with what appear to be very muscle skeletal chest pain. Having difficulty achieving pain control. Patient unable to sit up due to severe pain. When I withdrew evaluate patient is diaphoretic with diffuse rhonchorous breath sounds and expiratory wheezing with prolonged expiratory phase. Review chest x-ray shows no significant abnormality but based on his presentation do feel further diagnostic testing clinically indicated as he does not appear stable for discharge at this time as he seems just worsened.  ----------------------------------------- 1:31 AM on 09/13/2016 -----------------------------------------  CT imaging with multiple left-sided rib fractures as well as component of possible flail chest. Patient demonstrating flail chest is urology. He is having mild episodes of hypoxia when pain is not controlled but is currently better after IV Dilaudid. Does have a small trace area of hemothorax without pneumothorax. Patient and family are requesting transfer to [facility].  ----------------------------------------- 2:12 AM on 09/13/2016 -----------------------------------------  [facility] on diversion.  I spoke with Dr. Pat Aleta Manternach regarding the patient's presentation and need for admission for pain control and pulmonary toilette.  Remains HDS at this point.  Provided Is.  Will give levaquin for suspected aspiration.  Have discussed with the patient and available family all diagnostics and treatments performed thus far and all questions were answered to the best of my ability. The patient demonstrates understanding and agreement with plan.    ----------------------------------------- 3:41 AM on 09/13/2016 -----------------------------------------  Dr. Pat Niesha Bame evaluated patient and given the extent of his  rib fractures does recommend transfer to trauma center. He didn't see was full therefore spoke with Dr. Barry Dienes at [facility] who kindly agrees to evaluate patient at their facility.  Have discussed with the patient and available family all diagnostics and treatments performed thus far and all questions were answered to the best of my ability. The patient demonstrates understanding and agreement with plan.   Merlyn Lot, MD 09/13/16 [place], MD 09/13/16 [phone]

## 2016-09-12 NOTE — ED Triage Notes (Signed)
Pt in via EMS, pt complains of left side chest pain upon awakening from nap today.  Pt denies any accompanying symptoms.  Pt denies any recent injury.  Pt A/Ox4, vitals WDL, no immediate distress noted at this time.

## 2016-09-12 NOTE — ED Notes (Signed)
This RN attempted to D/C pt, pt states he is in too much pain to move or sit up. EDP made aware and pt given percocet. Will attempt to D/C again in 20 minutes

## 2016-09-12 NOTE — ED Notes (Signed)
Report from [place] at this time. Pt to discharge after pain meds help and is able to move.

## 2016-09-12 NOTE — ED Notes (Signed)
ED Provider at bedside due to patient still cant get up per wife.

## 2016-09-12 NOTE — ED Provider Notes (Addendum)
[facility] Emergency Department Provider Note   ____________________________________________   First MD Initiated Contact with Patient 09/12/16 1900     (approximate)  I have reviewed the triage vital signs and the nursing notes.   HISTORY  Chief Complaint Chest Pain    HPI Jerry Morgan is a 55 y.o. male with a history of hypertension and diabetes who is presenting to the emergency department today with left-sided chest pain. He says that he woke up from a nap at about 6:00 with 10 out of 10 sharp pain. He says that it worsens with movement and it has been constant. He was given pain medicine in the ambulance coming to the emergency department but had not taken anything at home prior to arrival. He denies any nausea, vomiting, chest pain or shortness of breath.    Past Medical History:  Diagnosis Date  . GERD (gastroesophageal reflux disease)   . Hypertension     There are no active problems to display for this patient.   Past Surgical History:  Procedure Laterality Date  . COLONOSCOPY WITH PROPOFOL N/A 02/09/2016   Procedure: COLONOSCOPY WITH PROPOFOL;  Surgeon: Hulen Luster, MD;  Location: [facility] ENDOSCOPY;  Service: Gastroenterology;  Laterality: N/A;  . ESOPHAGOGASTRODUODENOSCOPY (EGD) WITH PROPOFOL N/A 02/09/2016   Procedure: ESOPHAGOGASTRODUODENOSCOPY (EGD) WITH PROPOFOL;  Surgeon: Hulen Luster, MD;  Location: [facility] ENDOSCOPY;  Service: Gastroenterology;  Laterality: N/A;  . JOINT REPLACEMENT    . KNEE ARTHROSCOPY      Prior to Admission medications   Medication Sig Start Date End Date Taking? Authorizing Provider  azithromycin (ZITHROMAX Z-PAK) 250 MG tablet 2 tabs po once day 1, then 1 tab po qd for next 4 days Patient not taking: Reported on 09/04/2016 08/20/16   Norval Gable, MD  Blood Glucose Monitoring Suppl (ONE TOUCH ULTRA MINI) w/Device KIT daily. as directed 08/27/16   Historical Provider, MD  empagliflozin (JARDIANCE) 10 MG  TABS tablet Take 10 mg by mouth daily. 09/06/16   Juline Patch, MD  hydrALAZINE (APRESOLINE) 25 MG tablet Take 1 tablet (25 mg total) by mouth 2 (two) times daily. 09/04/16   Juline Patch, MD  losartan (COZAAR) 50 MG tablet Take 1 tablet (50 mg total) by mouth daily. 09/04/16   Juline Patch, MD  metFORMIN (GLUCOPHAGE) 500 MG tablet Take 1 tablet (500 mg total) by mouth 2 (two) times daily with a meal. 09/04/16   Juline Patch, MD  [facility] [place]  08/27/16   Historical Provider, MD    Allergies Penicillins  Family History  Problem Relation Age of Onset  . Diabetes Mother   . Hypertension Mother   . Heart disease Father   . Hypertension Father     Social History Social History  Substance Use Topics  . Smoking status: Never Smoker  . Smokeless tobacco: Never Used  . Alcohol use Yes     Comment: couple drinks per week    Review of Systems Constitutional: No fever/chills Eyes: No visual changes. ENT: No sore throat. Cardiovascular: As above Respiratory: Denies shortness of breath. Gastrointestinal: No abdominal pain.  No nausea, no vomiting.  No diarrhea.  No constipation. Genitourinary: Negative for dysuria. Musculoskeletal: Negative for back pain. Skin: Negative for rash. Neurological: Negative for headaches, focal weakness or numbness.  10-point ROS otherwise negative.  ____________________________________________   PHYSICAL EXAM:  VITAL SIGNS: ED Triage Vitals [09/12/16 1854]  Enc Vitals Group     BP Marland Kitchen)  148/83     Pulse Rate 84     Resp 12     Temp 97.8 F (36.6 C)     Temp Source Oral     SpO2 94 %     Weight 240 lb (108.9 kg)     Height 6' (1.829 m)     Head Circumference      Peak Flow      Pain Score 10     Pain Loc      Pain Edu?      Excl. in [place]?     Constitutional: Alert and oriented. Well appearing and in no acute distress. Eyes: Conjunctivae are normal. PERRL. EOMI. Head: Atraumatic. Nose: No  congestion/rhinnorhea. Mouth/Throat: Mucous membranes are moist.   Neck: No stridor.   Cardiovascular: Normal rate, regular rhythm. Grossly normal heart sounds.  Left sided chest pain is reproducible palpation. No crepitus. No deformity. Respiratory: Normal respiratory effort.  No retractions. Lungs CTAB. Gastrointestinal: Soft and nontender. No distention.  Musculoskeletal: No lower extremity tenderness nor edema.  No joint effusions. Neurologic:  Normal speech and language. No gross focal neurologic deficits are appreciated.  Skin:  Skin is warm, dry and intact. No rash noted. Psychiatric: Mood and affect are normal. Speech and behavior are normal.  ____________________________________________   LABS (all labs ordered are listed, but only abnormal results are displayed)  Labs Reviewed  BASIC METABOLIC PANEL - Abnormal; Notable for the following:       Result Value   Sodium 131 (*)    Chloride 98 (*)    CO2 17 (*)    Glucose, Bld 326 (*)    Anion gap 16 (*)    All other components within normal limits  BASIC METABOLIC PANEL - Abnormal; Notable for the following:    Sodium 132 (*)    CO2 18 (*)    Glucose, Bld 293 (*)    Creatinine, Ser 0.57 (*)    Calcium 8.5 (*)    All other components within normal limits  CBC  TROPONIN I   ____________________________________________  EKG  ED ECG REPORT I, Doran Stabler, the attending physician, personally viewed and interpreted this ECG.   Date: 09/12/2016  EKG Time: 1852  Rate: 75  Rhythm: normal sinus rhythm  Axis: Normal axis  Intervals:none  ST&T Change: No ST segment elevation or depression. No abnormal T-wave inversions.  ____________________________________________  [id number]    DG Chest 2 View (Final result)  Result time 09/12/16 19:22:44  Final result by Corrie Mckusick, DO (09/12/16 19:22:44)           Narrative:   CLINICAL DATA: 55 year old male with a history of left-sided  chest pain  EXAM: CHEST 2 VIEW  COMPARISON: None.  FINDINGS: Cardiomediastinal silhouette within normal limits. Calcifications of the aortic arch.  No evidence of central vascular congestion.  Low lung volumes accentuating the interstitium. No pneumothorax or pleural effusion. No confluent airspace disease.  IMPRESSION: Low lung volumes, with no evidence of lobar pneumonia.  Signed,  Dulcy Fanny. Earleen Newport, DO  [facility]  [facility]   Electronically Signed By: Corrie Mckusick D.O. On: 09/12/2016 19:22          ____________________________________________   PROCEDURES  Procedure(s) performed:   Procedures  Critical Care performed:   ____________________________________________   INITIAL IMPRESSION / ASSESSMENT AND PLAN / ED COURSE  Pertinent labs & imaging results that were available during my care of the patient were reviewed by me and  considered in my medical decision making (see chart for details).    Clinical Course   ----------------------------------------- 10:35 PM on 09/12/2016 -----------------------------------------  Patient with improved pain after morphine. Very atypical history for cardiac chest pain. Anion gap is now normal and glucose is reduced. Will be discharged to home. We'll discharge with a muscle relaxer as well as diclofenac gel. Patient will follow-up with his primary care doctor. Suspect chest wall pain.   ____________________________________________   FINAL CLINICAL IMPRESSION(S) / ED DIAGNOSES  Chest pain.    NEW MEDICATIONS STARTED DURING THIS VISIT:  New Prescriptions   No medications on file     Note:  This document was prepared using Dragon voice recognition software and may include unintentional dictation errors.    Orbie Pyo, MD 09/12/16 2235  Patient having pain upon movement to get off the bed for discharge. Given Percocet. Plan is  still for discharge once Percocet alleviates pain.    Orbie Pyo, MD 09/12/16 [phone]

## 2016-09-13 ENCOUNTER — Inpatient Hospital Stay (HOSPITAL_COMMUNITY)
Admission: AD | Admit: 2016-09-13 | Discharge: 2016-09-19 | DRG: 184 | Disposition: A | Discharge status: Home / Self Care | Payer: 59 | Source: Other Acute Inpatient Hospital | Attending: General Surgery | Admitting: General Surgery

## 2016-09-13 ENCOUNTER — Emergency Department: Payer: 59

## 2016-09-13 ENCOUNTER — Encounter (HOSPITAL_COMMUNITY): Payer: Self-pay | Admitting: General Practice

## 2016-09-13 DIAGNOSIS — E119 Type 2 diabetes mellitus without complications: Secondary | ICD-10-CM

## 2016-09-13 DIAGNOSIS — K567 Ileus, unspecified: Secondary | ICD-10-CM | POA: Diagnosis present

## 2016-09-13 DIAGNOSIS — Z7984 Long term (current) use of oral hypoglycemic drugs: Secondary | ICD-10-CM

## 2016-09-13 DIAGNOSIS — S225XXA Flail chest, initial encounter for closed fracture: Secondary | ICD-10-CM | POA: Diagnosis present

## 2016-09-13 DIAGNOSIS — Z833 Family history of diabetes mellitus: Secondary | ICD-10-CM | POA: Diagnosis not present

## 2016-09-13 DIAGNOSIS — E1165 Type 2 diabetes mellitus with hyperglycemia: Secondary | ICD-10-CM | POA: Diagnosis present

## 2016-09-13 DIAGNOSIS — S2242XA Multiple fractures of ribs, left side, initial encounter for closed fracture: Secondary | ICD-10-CM | POA: Diagnosis not present

## 2016-09-13 DIAGNOSIS — K219 Gastro-esophageal reflux disease without esophagitis: Secondary | ICD-10-CM | POA: Diagnosis present

## 2016-09-13 DIAGNOSIS — Z8249 Family history of ischemic heart disease and other diseases of the circulatory system: Secondary | ICD-10-CM

## 2016-09-13 DIAGNOSIS — S298XXA Other specified injuries of thorax, initial encounter: Secondary | ICD-10-CM | POA: Diagnosis present

## 2016-09-13 DIAGNOSIS — R591 Generalized enlarged lymph nodes: Secondary | ICD-10-CM | POA: Diagnosis present

## 2016-09-13 DIAGNOSIS — R140 Abdominal distension (gaseous): Secondary | ICD-10-CM

## 2016-09-13 DIAGNOSIS — E278 Other specified disorders of adrenal gland: Secondary | ICD-10-CM | POA: Diagnosis present

## 2016-09-13 DIAGNOSIS — S2239XA Fracture of one rib, unspecified side, initial encounter for closed fracture: Secondary | ICD-10-CM

## 2016-09-13 DIAGNOSIS — W228XXA Striking against or struck by other objects, initial encounter: Secondary | ICD-10-CM | POA: Diagnosis present

## 2016-09-13 DIAGNOSIS — S2249XA Multiple fractures of ribs, unspecified side, initial encounter for closed fracture: Secondary | ICD-10-CM

## 2016-09-13 DIAGNOSIS — J9600 Acute respiratory failure, unspecified whether with hypoxia or hypercapnia: Secondary | ICD-10-CM

## 2016-09-13 DIAGNOSIS — R339 Retention of urine, unspecified: Secondary | ICD-10-CM | POA: Diagnosis not present

## 2016-09-13 DIAGNOSIS — R0789 Other chest pain: Secondary | ICD-10-CM | POA: Diagnosis present

## 2016-09-13 DIAGNOSIS — I10 Essential (primary) hypertension: Secondary | ICD-10-CM | POA: Diagnosis present

## 2016-09-13 HISTORY — DX: Fracture of one rib, unspecified side, initial encounter for closed fracture: S22.39XA

## 2016-09-13 HISTORY — DX: Type 2 diabetes mellitus without complications: E11.9

## 2016-09-13 LAB — GLUCOSE, CAPILLARY
GLUCOSE-CAPILLARY: 271 mg/dL — AB (ref 65–99)
Glucose-Capillary: 287 mg/dL — ABNORMAL HIGH (ref 65–99)
Glucose-Capillary: 254 mg/dL — ABNORMAL HIGH (ref 65–99)
Glucose-Capillary: 257 mg/dL — ABNORMAL HIGH (ref 65–99)

## 2016-09-13 MED ORDER — HYDROMORPHONE HCL 1 MG/ML IJ SOLN
INTRAMUSCULAR | Status: AC
  Administered 2016-09-13: 1 mg via INTRAVENOUS
  Filled 2016-09-13: qty 1

## 2016-09-13 MED ORDER — INSULIN ASPART 100 UNIT/ML ~~LOC~~ SOLN
0.0000 [IU] | Freq: Three times a day (TID) | SUBCUTANEOUS | Status: DC
  Administered 2016-09-13 – 2016-09-15 (×7): 8 [IU] via SUBCUTANEOUS
  Administered 2016-09-15: 15 [IU] via SUBCUTANEOUS
  Administered 2016-09-15: 5 [IU] via SUBCUTANEOUS
  Administered 2016-09-16: 8 [IU] via SUBCUTANEOUS
  Administered 2016-09-16 – 2016-09-17 (×3): 5 [IU] via SUBCUTANEOUS
  Administered 2016-09-17: 8 [IU] via SUBCUTANEOUS
  Administered 2016-09-18 (×2): 5 [IU] via SUBCUTANEOUS
  Administered 2016-09-18: 3 [IU] via SUBCUTANEOUS

## 2016-09-13 MED ORDER — CARISOPRODOL 350 MG PO TABS
350.0000 mg | ORAL_TABLET | Freq: Three times a day (TID) | ORAL | Status: DC | PRN
Start: 2016-09-13 — End: 2016-09-19
  Administered 2016-09-13 – 2016-09-16 (×8): 350 mg via ORAL
  Filled 2016-09-13 (×8): qty 1

## 2016-09-13 MED ORDER — HYDRALAZINE HCL 25 MG PO TABS
25.0000 mg | ORAL_TABLET | Freq: Two times a day (BID) | ORAL | Status: DC
  Administered 2016-09-13 – 2016-09-14 (×3): 25 mg via ORAL
  Filled 2016-09-13 (×3): qty 1

## 2016-09-13 MED ORDER — HYDROMORPHONE HCL 2 MG/ML IJ SOLN
0.5000 mg | INTRAMUSCULAR | Status: DC | PRN
  Administered 2016-09-17: 0.5 mg via INTRAVENOUS
  Filled 2016-09-13: qty 1

## 2016-09-13 MED ORDER — IOPAMIDOL (ISOVUE-370) INJECTION 76%
75.0000 mL | Freq: Once | INTRAVENOUS | Status: AC | PRN
  Administered 2016-09-13: 75 mL via INTRAVENOUS

## 2016-09-13 MED ORDER — ENOXAPARIN SODIUM 30 MG/0.3ML ~~LOC~~ SOLN
30.0000 mg | Freq: Two times a day (BID) | SUBCUTANEOUS | Status: DC
  Administered 2016-09-13 – 2016-09-19 (×13): 30 mg via SUBCUTANEOUS
  Filled 2016-09-13 (×13): qty 0.3

## 2016-09-13 MED ORDER — DICLOFENAC SODIUM 1 % TD GEL
1.0000 "application " | Freq: Two times a day (BID) | TRANSDERMAL | Status: DC | PRN
  Administered 2016-09-16: 1 via TOPICAL
  Filled 2016-09-13: qty 100

## 2016-09-13 MED ORDER — IPRATROPIUM-ALBUTEROL 0.5-2.5 (3) MG/3ML IN SOLN
3.0000 mL | Freq: Four times a day (QID) | RESPIRATORY_TRACT | Status: DC
  Administered 2016-09-13 (×3): 3 mL via RESPIRATORY_TRACT
  Filled 2016-09-13 (×3): qty 3

## 2016-09-13 MED ORDER — BETHANECHOL CHLORIDE 25 MG PO TABS
25.0000 mg | ORAL_TABLET | Freq: Four times a day (QID) | ORAL | Status: DC
  Administered 2016-09-13 – 2016-09-16 (×14): 25 mg via ORAL
  Filled 2016-09-13 (×14): qty 1

## 2016-09-13 MED ORDER — PANTOPRAZOLE SODIUM 40 MG IV SOLR: 40.0000 mg | Freq: Two times a day (BID) | INTRAVENOUS | Status: DC

## 2016-09-13 MED ORDER — HYDROMORPHONE HCL 1 MG/ML IJ SOLN
1.0000 mg | Freq: Once | INTRAMUSCULAR | Status: AC
  Administered 2016-09-13: 1 mg via INTRAVENOUS

## 2016-09-13 MED ORDER — TAMSULOSIN HCL 0.4 MG PO CAPS
0.4000 mg | ORAL_CAPSULE | Freq: Every day | ORAL | Status: DC
  Administered 2016-09-13 – 2016-09-19 (×7): 0.4 mg via ORAL
  Filled 2016-09-13 (×7): qty 1

## 2016-09-13 MED ORDER — PANTOPRAZOLE SODIUM 40 MG PO TBEC
40.0000 mg | DELAYED_RELEASE_TABLET | Freq: Two times a day (BID) | ORAL | Status: DC
  Administered 2016-09-13 – 2016-09-19 (×13): 40 mg via ORAL
  Filled 2016-09-13 (×13): qty 1

## 2016-09-13 MED ORDER — CYCLOBENZAPRINE HCL 10 MG PO TABS
5.0000 mg | ORAL_TABLET | Freq: Once | ORAL | Status: AC
  Administered 2016-09-13: 5 mg via ORAL
  Filled 2016-09-13: qty 1

## 2016-09-13 MED ORDER — FENTANYL CITRATE (PF) 100 MCG/2ML IJ SOLN
100.0000 ug | INTRAMUSCULAR | Status: DC | PRN
  Administered 2016-09-13: 100 ug via INTRAVENOUS
  Filled 2016-09-13: qty 2

## 2016-09-13 MED ORDER — OXYCODONE HCL 5 MG PO TABS
5.0000 mg | ORAL_TABLET | ORAL | Status: DC | PRN
  Administered 2016-09-13: 10 mg via ORAL
  Administered 2016-09-13 – 2016-09-14 (×2): 15 mg via ORAL
  Filled 2016-09-13: qty 3
  Filled 2016-09-13: qty 2
  Filled 2016-09-13: qty 3

## 2016-09-13 MED ORDER — ONDANSETRON HCL 4 MG PO TABS: 4.0000 mg | ORAL_TABLET | Freq: Four times a day (QID) | ORAL | Status: DC | PRN

## 2016-09-13 MED ORDER — INSULIN GLARGINE 100 UNIT/ML ~~LOC~~ SOLN
21.0000 [IU] | Freq: Every day | SUBCUTANEOUS | Status: DC
  Administered 2016-09-13: 21 [IU] via SUBCUTANEOUS
  Filled 2016-09-13 (×2): qty 0.21

## 2016-09-13 MED ORDER — POLYETHYLENE GLYCOL 3350 17 G PO PACK
17.0000 g | PACK | Freq: Every day | ORAL | Status: DC
  Administered 2016-09-13 – 2016-09-16 (×4): 17 g via ORAL
  Filled 2016-09-13 (×4): qty 1

## 2016-09-13 MED ORDER — ONDANSETRON HCL 4 MG/2ML IJ SOLN
4.0000 mg | Freq: Four times a day (QID) | INTRAMUSCULAR | Status: DC | PRN
  Administered 2016-09-17: 4 mg via INTRAVENOUS
  Filled 2016-09-13: qty 2

## 2016-09-13 MED ORDER — LEVOFLOXACIN IN D5W 500 MG/100ML IV SOLN
500.0000 mg | Freq: Once | INTRAVENOUS | Status: AC
  Administered 2016-09-13: 500 mg via INTRAVENOUS
  Filled 2016-09-13: qty 100

## 2016-09-13 MED ORDER — HYDROMORPHONE HCL 2 MG/ML IJ SOLN
1.0000 mg | INTRAMUSCULAR | Status: DC | PRN
  Administered 2016-09-13 – 2016-09-14 (×4): 1 mg via INTRAVENOUS
  Filled 2016-09-13 (×4): qty 1

## 2016-09-13 MED ORDER — DOCUSATE SODIUM 100 MG PO CAPS
100.0000 mg | ORAL_CAPSULE | Freq: Two times a day (BID) | ORAL | Status: DC
  Administered 2016-09-13 – 2016-09-19 (×13): 100 mg via ORAL
  Filled 2016-09-13 (×13): qty 1

## 2016-09-13 MED ORDER — LOSARTAN POTASSIUM 50 MG PO TABS
50.0000 mg | ORAL_TABLET | Freq: Every day | ORAL | Status: DC
  Administered 2016-09-13: 50 mg via ORAL
  Filled 2016-09-13: qty 1

## 2016-09-13 MED ORDER — HYDRALAZINE HCL 20 MG/ML IJ SOLN
10.0000 mg | INTRAMUSCULAR | Status: DC | PRN
  Administered 2016-09-13 – 2016-09-14 (×4): 10 mg via INTRAVENOUS
  Filled 2016-09-13 (×4): qty 1

## 2016-09-13 MED ORDER — IPRATROPIUM-ALBUTEROL 0.5-2.5 (3) MG/3ML IN SOLN
3.0000 mL | Freq: Two times a day (BID) | RESPIRATORY_TRACT | Status: DC
  Administered 2016-09-14 (×2): 3 mL via RESPIRATORY_TRACT
  Filled 2016-09-13: qty 3

## 2016-09-13 NOTE — Evaluation (Signed)
Occupational Therapy Evaluation Patient Details Name: Jerry Morgan MRN: [id number] DOB: [birth year] Today's Date: 09/13/2016    History of Present Illness This 55 y.o. male admitted after being knocked into door frame by his dog.  He sustained Lt posterior rib fractures 5-10, and flail chest segment 5-7, aspiration.  PMH includes:  DM, HTN, adrenal nodule lymphadenopathy   Clinical Impression   Pt admitted with above. He demonstrates the below listed deficits and will benefit from continued OT to maximize safety and independence with BADLs.  Pt presents to OT with decreased activity tolerance due to pain.  He was only able to move to EOB this afternoon with pain increasing to 9/10 and pt having to return to supine.  He requires mod - max A for ADLs - wife able to assist him as needed at discharge.  Will continue to follow acutely.         Follow Up Recommendations  No OT follow up;Supervision/Assistance - 24 hour    Equipment Recommendations  None recommended by OT    Recommendations for Other Services       Precautions / Restrictions Precautions Precautions: Fall Restrictions Weight Bearing Restrictions: No      Mobility Bed Mobility Overal bed mobility: Needs Assistance Bed Mobility: Supine to Sit;Sit to Supine     Supine to sit: Min assist;HOB elevated Sit to supine: Min assist;HOB elevated   General bed mobility comments: with HOB fully up.  Pt moved to EOB with assist to shift hips to the edge and to lift trunk   Transfers                 General transfer comment: Pt unable due to severity of pain once pt moved to EOB     Balance Overall balance assessment: Needs assistance Sitting-balance support: Feet supported Sitting balance-Leahy Scale: Fair                                      ADL Overall ADL's : Needs assistance/impaired Eating/Feeding: Independent   Grooming: Wash/dry hands;Wash/dry face;Brushing hair;Minimal  assistance;Bed level   Upper Body Bathing: Moderate assistance;Bed level   Lower Body Bathing: Maximal assistance;Bed level   Upper Body Dressing : Maximal assistance;Bed level   Lower Body Dressing: Total assistance;Sit to/from stand   Toilet Transfer: Total assistance Toilet Transfer Details (indicate cue type and reason): unable due to pain  Toileting- Clothing Manipulation and Hygiene: Total assistance;Bed level       Functional mobility during ADLs: Minimal assistance (EOB only ) General ADL Comments: Pt limited by pain      Vision     Perception     Praxis      Pertinent Vitals/Pain Pain Assessment: 0-10 Pain Score: 9  Pain Location: Lt chest  Pain Descriptors / Indicators: Moaning;Grimacing;Guarding;Stabbing;Throbbing Pain Intervention(s): Limited activity within patient's tolerance;Repositioned;Patient requesting pain meds-RN notified;Premedicated before session     Hand Dominance Right   Extremity/Trunk Assessment Upper Extremity Assessment Upper Extremity Assessment: Generalized weakness (limited due to pain )   Lower Extremity Assessment Lower Extremity Assessment: Defer to PT evaluation   Cervical / Trunk Assessment Cervical / Trunk Assessment: Other exceptions Cervical / Trunk Exceptions: multiple rib fractures    Communication Communication Communication: No difficulties   Cognition Arousal/Alertness: Awake/alert Behavior During Therapy: WFL for tasks assessed/performed Overall Cognitive Status: Within Functional Limits for tasks assessed  General Comments       Exercises       Shoulder Instructions      Home Living Family/patient expects to be discharged to:: Private residence Living Arrangements: Spouse/significant other Available Help at Discharge: Family;Available PRN/intermittently Type of Home: House Home Access: Stairs to enter [employer] of Steps: 2 Entrance Stairs-Rails: None Home  Layout: One level     Bathroom Shower/Tub: Occupational psychologist: Handicapped height     Home Equipment: Shower seat - built in;Grab bars - tub/shower   Additional Comments: wife works during the day       Prior Functioning/Environment Level of Independence: Independent                 OT Problem List: Decreased strength;Decreased activity tolerance;Decreased knowledge of use of DME or AE;Pain   OT Treatment/Interventions: Self-care/ADL training;DME and/or AE instruction;Therapeutic activities;Patient/family education    OT Goals(Current goals can be found in the care plan section) Acute Rehab OT Goals Patient Stated Goal: to go home and less pain  OT Goal Formulation: With patient Time For Goal Achievement: 09/27/16 Potential to Achieve Goals: Good ADL Goals Pt Will Perform Grooming: with min guard assist;standing Pt Will Perform Upper Body Bathing: with supervision;with set-up;sitting Pt Will Perform Lower Body Bathing: with min guard assist;sit to/from stand;with adaptive equipment Pt Will Perform Upper Body Dressing: with supervision;with set-up;sitting Pt Will Perform Lower Body Dressing: with min guard assist;with adaptive equipment;sit to/from stand Pt Will Transfer to Toilet: with min guard assist;ambulating;regular height toilet;bedside commode;grab bars Pt Will Perform Toileting - Clothing Manipulation and hygiene: with min guard assist;sit to/from stand  OT Frequency: Min 2X/week   Barriers to D/C:            Co-evaluation              End of Session Nurse Communication: Mobility status  Activity Tolerance: Patient limited by pain Patient left: in bed;with call bell/phone within reach;with family/visitor present   Time: [id number] OT Time Calculation (min): 22 min Charges:  OT General Charges $OT Visit: 1 Procedure OT Evaluation $OT Eval Moderate Complexity: 1 Procedure G-Codes:    Akiah Bauch M 09-22-2016, 1:52 PM

## 2016-09-13 NOTE — ED Notes (Signed)
Pt's wife, Riker Flemming, would like to be called for any change in patient status/when patient is transferred.   [phone]

## 2016-09-13 NOTE — ED Notes (Signed)
Called patient's wife per patient request to let her know patient on the way to [facility]

## 2016-09-13 NOTE — H&P (Signed)
History   Jerry Morgan is an 55 y.o. male.   Chief Complaint: No chief complaint on file.   Patient is a 55 yo M who presented to [facility] emergency department after being jerked against a door frame by his son's dog.  His son is in the [employer] in [place] and he is keeping the dog while he is deployed.  The dog is "a runner" and they live a block away from a busy road.  He was trying to keep the dog from getting out and in doing so got injured.  The patient denies fall or strike to head.  He denies nausea.  His only complaint is severe left sided pain.  The patient was quite dyspneic at [place], but shortness of breath is better at this point.  He was evaluated by surgery at [facility], but given flail chest segment, they thought he would be better served at trauma center.  Family requested transfer to [facility], but they are on trauma diversion.      Past Medical History:  Diagnosis Date  . Diabetes mellitus without complication ([place])   . GERD (gastroesophageal reflux disease)   . Hypertension     Past Surgical History:  Procedure Laterality Date  . COLONOSCOPY WITH PROPOFOL N/A 02/09/2016   Procedure: COLONOSCOPY WITH PROPOFOL;  Surgeon: Hulen Luster, MD;  Location: [facility] ENDOSCOPY;  Service: Gastroenterology;  Laterality: N/A;  . ESOPHAGOGASTRODUODENOSCOPY (EGD) WITH PROPOFOL N/A 02/09/2016   Procedure: ESOPHAGOGASTRODUODENOSCOPY (EGD) WITH PROPOFOL;  Surgeon: Hulen Luster, MD;  Location: [facility] ENDOSCOPY;  Service: Gastroenterology;  Laterality: N/A;  . JOINT REPLACEMENT    . KNEE ARTHROSCOPY      Family History  Problem Relation Age of Onset  . Diabetes Mother   . Hypertension Mother   . Heart disease Father   . Hypertension Father    Social History:  reports that he has never smoked. He has never used smokeless tobacco. He reports that he drinks alcohol. He reports that he does not use drugs.  Allergies   Allergies  Allergen Reactions  . Penicillins Hives    Has patient had  a PCN reaction causing immediate rash, facial/tongue/throat swelling, SOB or lightheadedness with hypotension: yes Has patient had a PCN reaction causing severe rash involving mucus membranes or skin necrosis: no Has patient had a PCN reaction that required hospitalization no Has patient had a PCN reaction occurring within the last 10 years: no If all of the above answers are "NO", then may proceed with Cephalosporin use.      Home Medications   Medications Prior to Admission  Medication Sig Dispense Refill  . azithromycin (ZITHROMAX Z-PAK) 250 MG tablet 2 tabs po once day 1, then 1 tab po qd for next 4 days 6 each 0  . Blood Glucose Monitoring Suppl (ONE TOUCH ULTRA MINI) w/Device KIT daily. as directed  0  . carisoprodol (SOMA) 350 MG tablet Take 1 tablet (350 mg total) by mouth 3 (three) times daily as needed for muscle spasms. 15 tablet 0  . Diclofenac Sodium 3 % GEL Place 1 application onto the skin every 12 (twelve) hours as needed (pain). 50 g 0  . empagliflozin (JARDIANCE) 10 MG TABS tablet Take 10 mg by mouth daily. (Patient not taking: Reported on 09/13/2016) 30 tablet 0  . esomeprazole (NEXIUM) 20 MG capsule Take 20 mg by mouth daily at 12 noon.    . hydrALAZINE (APRESOLINE) 25 MG tablet Take 1 tablet (25 mg total) by mouth 2 (  two) times daily. 60 tablet 1  . losartan (COZAAR) 50 MG tablet Take 1 tablet (50 mg total) by mouth daily. 30 tablet 1  . metFORMIN (GLUCOPHAGE) 500 MG tablet Take 1 tablet (500 mg total) by mouth 2 (two) times daily with a meal. 60 tablet 1  . ONETOUCH DELICA LANCETS FINE MISC       Trauma Course   Results for orders placed or performed during the hospital encounter of 09/12/16 (from the past 48 hour(s))  Basic metabolic panel     Status: Abnormal   Collection Time: 09/12/16  6:52 PM  Result Value Ref Range   Sodium 131 (L) 135 - 145 mmol/L   Potassium 4.5 3.5 - 5.1 mmol/L   Chloride 98 (L) 101 - 111 mmol/L   CO2 17 (L) 22 - 32 mmol/L   Glucose,  Bld 326 (H) 65 - 99 mg/dL   BUN 16 6 - 20 mg/dL   Creatinine, Ser 0.64 0.61 - 1.24 mg/dL   Calcium 9.3 8.9 - 10.3 mg/dL   GFR calc non Af Amer >60 >60 mL/min   GFR calc Af Amer >60 >60 mL/min    Comment: (NOTE) The eGFR has been calculated using the CKD EPI equation. This calculation has not been validated in all clinical situations. eGFR's persistently <60 mL/min signify possible Chronic Kidney Disease.    Anion gap 16 (H) 5 - 15  CBC     Status: None   Collection Time: 09/12/16  6:52 PM  Result Value Ref Range   WBC 10.2 3.8 - 10.6 K/uL   RBC 4.61 4.40 - 5.90 MIL/uL   Hemoglobin 14.0 13.0 - 18.0 g/dL   HCT 40.9 40.0 - 52.0 %   MCV 88.9 80.0 - 100.0 fL   MCH 30.3 26.0 - 34.0 pg   MCHC 34.1 32.0 - 36.0 g/dL   RDW 13.4 11.5 - 14.5 %   Platelets 200 150 - 440 K/uL  Troponin I     Status: None   Collection Time: 09/12/16  6:52 PM  Result Value Ref Range   Troponin I <0.03 <0.03 ng/mL  Basic metabolic panel     Status: Abnormal   Collection Time: 09/12/16  9:48 PM  Result Value Ref Range   Sodium 132 (L) 135 - 145 mmol/L   Potassium 4.0 3.5 - 5.1 mmol/L   Chloride 101 101 - 111 mmol/L   CO2 18 (L) 22 - 32 mmol/L   Glucose, Bld 293 (H) 65 - 99 mg/dL   BUN 16 6 - 20 mg/dL   Creatinine, Ser 0.57 (L) 0.61 - 1.24 mg/dL   Calcium 8.5 (L) 8.9 - 10.3 mg/dL   GFR calc non Af Amer >60 >60 mL/min   GFR calc Af Amer >60 >60 mL/min    Comment: (NOTE) The eGFR has been calculated using the CKD EPI equation. This calculation has not been validated in all clinical situations. eGFR's persistently <60 mL/min signify possible Chronic Kidney Disease.    Anion gap 13 5 - 15   Dg Chest 2 View  Result Date: 09/12/2016 CLINICAL DATA:  55 year old male with a history of left-sided chest pain EXAM: CHEST  2 VIEW COMPARISON:  None. FINDINGS: Cardiomediastinal silhouette within normal limits. Calcifications of the aortic arch. No evidence of central vascular congestion. Low lung volumes  accentuating the interstitium. No pneumothorax or pleural effusion. No confluent airspace disease. IMPRESSION: Low lung volumes, with no evidence of lobar pneumonia. Signed, Dulcy Fanny. Earleen Newport, DO [facility] Radiology Electronically Signed   By: Corrie Mckusick D.O.   On: 09/12/2016 19:22   Ct Angio Chest Pe W And/or Wo Contrast  Result Date: 09/13/2016 CLINICAL DATA:  Severe LEFT chest wall pain beginning this afternoon. History of hypertension and diabetes. EXAM: CT ANGIOGRAPHY CHEST WITH CONTRAST TECHNIQUE: Multidetector CT imaging of the chest was performed using the standard protocol during bolus administration of intravenous contrast. Multiplanar CT image reconstructions and MIPs were obtained to evaluate the vascular anatomy. CONTRAST:  75 cc Isovue 370 COMPARISON:  Chest radiograph September 12, 2016 FINDINGS: CARDIOVASCULAR: Stab adequate contrast opacification of the pulmonary artery's (main pulmonary artery is 197 Hounsfield units, target is 250 Hounsfield units), delayed phase. Main pulmonary artery is not enlarged. No pulmonary arterial filling defects to the level of the segmental branches. Heart size is mildly enlarged, no right heart strain. No pericardial effusions. Thoracic aorta is normal course and caliber, mild calcific atherosclerosis of the aortic arch. MEDIASTINUM/NODES: No lymphadenopathy by CT size criteria. Small calcified lymph nodes RIGHT hilum. subcentimeter RIGHT peritracheal rounded lymph nodes. LUNGS/PLEURA: Debris layering in trachea and RIGHT mainstem bronchus. Mild bronchial wall thickening. Small LEFT hemothorax. No pneumothorax. Dependent atelectasis without focal consolidations/contusion. UPPER ABDOMEN: Included view of the abdomen is nonacute. 11 mm LEFT adrenal nodule, 30 Hounsfield units. 10 mm LEFT retroperitoneal lymph nodes. Multiple 14 mm short access gastrohepatic lymph lymph nodes. Low-density liver, mild hepatomegaly.  MUSCULOSKELETAL: Acute nondisplaced LEFT posterior fifth through tenth rib fractures. Acute mildly displaced LEFT lateral fourth through seventh rib fractures. Multiple old RIGHT posterolateral rib fractures. Review of the MIP images confirms the above findings. IMPRESSION: No acute pulmonary embolism, suboptimal bolus timing. Acute LEFT fourth through tenth rib fractures, with segmental fractures fifth through seventh ribs consistent with flail chest. Small LEFT hemothorax without pneumothorax. Debris within trachea and RIGHT mainstem bronchus consistent with aspiration. Multiple subcentimeter RIGHT peritracheal lymph nodes with rounded suspicious morphology. Additional partially imaged abdominal lymphadenopathy. Hepatomegaly. Recommended dedicated CT of the abdomen and pelvis with contrast on a nonemergent basis. **An incidental finding of potential clinical significance has been found. 11 mm LEFT adrenal nodule probably benign. Consider 12 month follow-up adrenal CT.** Electronically Signed   By: Elon Alas M.D.   On: 09/13/2016 01:14    Review of Systems  Constitutional: Negative.   HENT: Negative.   Eyes: Negative.   Respiratory: Positive for shortness of breath (improving).   Cardiovascular: Positive for chest pain.  Gastrointestinal: Negative.   Genitourinary: Negative.   Musculoskeletal: Negative.   Skin: Negative.   Neurological: Negative.   Endo/Heme/Allergies:       Recently started on metformin for DM  Psychiatric/Behavioral: Negative.     Blood pressure (!) 193/101, pulse 97, temperature 97.9 F (36.6 C), temperature source Oral, resp. rate 17, height 6' (1.829 m), weight 106.6 kg (235 lb), SpO2 97 %. Physical Exam  Constitutional: He is oriented to person, place, and time. He appears well-developed and well-nourished. He appears distressed (looks uncomfortable).  HENT:  Head: Normocephalic and atraumatic.  Eyes: Conjunctivae are normal. Pupils are equal, round, and  reactive to light. Right eye exhibits no discharge. Left eye exhibits no discharge. No scleral icterus.  Neck: Normal range of motion. Neck supple. No tracheal deviation present. No thyromegaly present.  Cardiovascular: Normal rate and intact distal pulses.   Respiratory: Effort normal. No respiratory distress. He exhibits tenderness (left side).  GI: Soft. He exhibits no distension. There is no tenderness. There is no rebound and  no guarding.  Musculoskeletal: Normal range of motion. He exhibits no edema, tenderness or deformity.  Lymphadenopathy:    He has no cervical adenopathy.  Neurological: He is alert and oriented to person, place, and time. Coordination normal.  Skin: Skin is warm and dry. No rash noted. He is not diaphoretic. No erythema. No pallor.  Psychiatric: He has a normal mood and affect. His behavior is normal. Judgment and thought content normal.     Assessment/Plan Left rib fractures posterior 5-10 with flail chest segment 5-7 Aspiration DM HTN Lymphadenopathy Adrenal nodule  Pulmonary toilet, nebulizers Pain control Discussed importance of pulmonary toilet on pneumonia prevention Home meds for HTN, DM.  May need insulin on top of this.  Some of hyperglycemia (326) may be from stress response.   Needs CT abd/pelvis to evaluate lymphadenopathy in abdomen.   Adrenal nodule has suggested follow up imaging.    Jerry Morgan 09/13/2016, 7:09 AM   Procedures

## 2016-09-13 NOTE — ED Notes (Signed)
Patient transported back from CT 

## 2016-09-13 NOTE — Progress Notes (Signed)
LCSW has seen patient. No concerns by patient or needs at this time. SBIRT completed.  No concerns of SA involved at time of admission. Patient lives with wife and will DC home with wife once medically stable.  CSW signing off.    Lane Hacker, MSW Clinical Social Work: [employer] Coverage for :  [phone]

## 2016-09-13 NOTE — Progress Notes (Addendum)
Inpatient Diabetes Program Recommendations  AACE/ADA: New Consensus Statement on Inpatient Glycemic Control (2015)  Target Ranges:  Prepandial:   less than 140 mg/dL      Peak postprandial:   less than 180 mg/dL (1-2 hours)      Critically ill patients:  140 - 180 mg/dL   Lab Results  Component Value Date   HGBA1C 11.1 (H) 09/04/2016    Review of Glycemic Control Results for Jerry Morgan, Jerry Morgan (MRN [id number]) as of 09/13/2016 08:25  Ref. Range 09/13/2016 07:48  Glucose-Capillary Latest Ref Range: 65 - 99 mg/dL 287 (H)    Diabetes history: Type 2 Outpatient Diabetes medications:Jardience 10mg /day, Metformin 500mg  bid Current orders for Inpatient glycemic control: none  Inpatient Diabetes Program Recommendations:  Consider starting the patient on moderate correction insulin (0-15 units)  tid and Novolog 0-5 units qhs.    A1C is 11.1% and fasting blood sugar 287mg /dl; consider Lantus 21 units qday (0.2 units/kg)   Gentry Fitz, RN, [place], [place], CDE Diabetes Coordinator Inpatient Diabetes Program  [phone] (Team Pager) [phone] ([place]) 09/13/2016 7:33 AM

## 2016-09-13 NOTE — Progress Notes (Signed)
Inpatient Diabetes Program Recommendations  AACE/ADA: New Consensus Statement on Inpatient Glycemic Control (2015)  Target Ranges:  Prepandial:   less than 140 mg/dL      Peak postprandial:   less than 180 mg/dL (1-2 hours)      Critically ill patients:  140 - 180 mg/dL   Lab Results  Component Value Date   GLUCAP 254 (H) 09/13/2016   HGBA1C 11.1 (H) 09/04/2016   Met with patient and male visitor at the bedside.  Patient tells me his blood sugars and A1C have been high for a while.  MD at outpatient walk in clinic started him on "a medication" and then Dr. Ronnald Ramp started him on Jardiance a few weeks ago but the patient hasn't started it.   Discussed the importance of getting the blood sugars under control, and suggested they speak to Dr. Ronnald Ramp for a referral to outpatient diabetes education at the [facility] in [place] if they need additional assistance. Recommended the patient check blood sugars at least everyday, once a day.   Reviewed the properties of Jardiance and the potential side effects- recommended follow up with MD after he is discharged from hospital and has been taking the [place] consistently.   Discussed the inpatient protocol for blood sugar management and my request to the MD to start on insulin to control blood sugar.  Gentry Fitz, RN, BA, MHA, CDE Diabetes Coordinator Inpatient Diabetes Program  [phone] (Team Pager) [phone] ([place]) 09/13/2016 2:48 PM

## 2016-09-13 NOTE — Evaluation (Signed)
Physical Therapy Evaluation Patient Details Name: Jerry Morgan MRN: [id number] DOB: 28-Jul-1961 Today's Date: 09/13/2016   History of Present Illness  This 55 y.o. male admitted after being knocked into door frame by his dog.  He sustained Lt posterior rib fractures 5-10, and flail chest segment 5-7, aspiration.  PMH includes:  DM, HTN, adrenal nodule lymphadenopathy  Clinical Impression  Pt significantly limited this session by pain.  Will benefit from further acute PT services to increase mobility and activity tolerance prior to d/c home with intermittent assist from family.      Follow Up Recommendations No PT follow up    Equipment Recommendations   (to be determined)    Recommendations for Other Services       Precautions / Restrictions Precautions Precautions: Fall Restrictions Weight Bearing Restrictions: No      Mobility  Bed Mobility Overal bed mobility: Needs Assistance Bed Mobility: Supine to Sit;Sit to Supine Rolling: Mod assist Sidelying to sit:  (unable) Supine to sit: Min assist;HOB elevated Sit to supine: Min assist;HOB elevated   General bed mobility comments: with HOB fully up.  Pt moved to EOB with assist to shift hips to the edge and to lift trunk   Transfers                 General transfer comment: Pt unable due to severity of pain once pt moved to EOB   Ambulation/Gait             General Gait Details: Will need to further assess as pt significantly limited by pain this session  Stairs            Wheelchair Mobility    Modified Rankin (Stroke Patients Only)       Balance Overall balance assessment: Needs assistance Sitting-balance support: Feet supported Sitting balance-Leahy Scale: Fair                                       Pertinent Vitals/Pain Pain Assessment: 0-10 Pain Score: 9  Pain Location: Lt chest  Pain Descriptors / Indicators: Moaning;Grimacing;Guarding;Stabbing;Throbbing Pain  Intervention(s): Limited activity within patient's tolerance;Repositioned;Patient requesting pain meds-RN notified;Premedicated before session    [place] expects to be discharged to:: Private residence Living Arrangements: Spouse/significant other Available Help at Discharge: Family;Available PRN/intermittently Type of Home: House Home Access: Stairs to enter Entrance Stairs-Rails: None Entrance Stairs-Number of Steps: 2 Home Layout: One level Home Equipment: Shower seat - built in;Grab bars - tub/shower Additional Comments: wife works during the day     Prior Function Level of Independence: Independent               Hand Dominance   Dominant Hand: Right    Extremity/Trunk Assessment   Upper Extremity Assessment Upper Extremity Assessment: Generalized weakness (limited due to pain )    Lower Extremity Assessment Lower Extremity Assessment: Defer to PT evaluation    Cervical / Trunk Assessment Cervical / Trunk Assessment: Other exceptions Cervical / Trunk Exceptions: multiple rib fractures   Communication   Communication: No difficulties  Cognition Arousal/Alertness: Awake/alert Behavior During Therapy: WFL for tasks assessed/performed Overall Cognitive Status: Within Functional Limits for tasks assessed                      General Comments General comments (skin integrity, edema, etc.): HR 89    Exercises     Assessment/Plan  PT Assessment Patient needs continued PT services  PT Problem List Decreased strength;Decreased range of motion;Decreased activity tolerance;Decreased mobility;Pain          PT Treatment Interventions DME instruction;Gait training;Stair training;Functional mobility training;Therapeutic activities;Balance training;Therapeutic exercise;Patient/family education    PT Goals (Current goals can be found in the Care Plan section)  Acute Rehab PT Goals Patient Stated Goal: to go home and less pain  PT Goal  Formulation: With patient Time For Goal Achievement: 09/20/16 Potential to Achieve Goals: Good    Frequency Min 3X/week   Barriers to discharge Inaccessible home environment;Decreased caregiver support Pt's wife works during the day    Co-evaluation               End of Session   Activity Tolerance: Patient limited by pain Patient left: in bed;with call bell/phone within reach;with family/visitor present Nurse Communication: Mobility status;Patient requests pain meds         Time: 1100-1114 PT Time Calculation (min) (ACUTE ONLY): 14 min   Charges:   PT Evaluation $PT Eval Low Complexity: 1 Procedure     PT G Codes:        Martise Waddell E Penven-Crew 09/13/2016, 2:03 PM

## 2016-09-13 NOTE — Care Management Note (Signed)
Case Management Note  Patient Details  Name: Mims Malta MRN: [id number] Date of Birth: November 22, 1960  Subjective/Objective:  Pt admitted on  09/13/16 after being jerked against a door frame by his son's dog.  Pt sustained Lt rib fractures 5-10 with flail chest segment 5-7.  PTA, pt independent, lives with spouse.                    Action/Plan: PT/OT recommending no OP follow up.  Pt states his wife can provide care at dc.  She works, but can work from home, if needed.  Will follow progress.    Expected Discharge Date:                  Expected Discharge Plan:  Home/Self Care  In-House Referral:     Discharge planning Services  CM Consult  Post Acute Care Choice:    Choice offered to:     DME Arranged:    DME Agency:     HH Arranged:    HH Agency:     Status of Service:  In process, will continue to follow  If discussed at Long Length of Stay Meetings, dates discussed:    Additional Comments:  Reinaldo Raddle, RN, BSN  Trauma/Neuro ICU Case Manager [phone]

## 2016-09-13 NOTE — Progress Notes (Signed)
OT Cancellation Note  Patient Details Name: Jerry Morgan MRN: [id number] DOB: [birth year]   Cancelled Treatment:    Reason Eval/Treat Not Completed: Pain limiting ability to participate.  Will try back.  [place], OTR/L [id number]   Lucille Passy M 09/13/2016, 12:05 PM

## 2016-09-14 ENCOUNTER — Inpatient Hospital Stay (HOSPITAL_COMMUNITY): Payer: 59

## 2016-09-14 ENCOUNTER — Encounter (HOSPITAL_COMMUNITY): Payer: Self-pay | Admitting: Radiology

## 2016-09-14 DIAGNOSIS — E119 Type 2 diabetes mellitus without complications: Secondary | ICD-10-CM

## 2016-09-14 DIAGNOSIS — R339 Retention of urine, unspecified: Secondary | ICD-10-CM | POA: Diagnosis not present

## 2016-09-14 DIAGNOSIS — S298XXA Other specified injuries of thorax, initial encounter: Secondary | ICD-10-CM | POA: Diagnosis present

## 2016-09-14 DIAGNOSIS — I10 Essential (primary) hypertension: Secondary | ICD-10-CM | POA: Diagnosis present

## 2016-09-14 DIAGNOSIS — R591 Generalized enlarged lymph nodes: Secondary | ICD-10-CM | POA: Diagnosis present

## 2016-09-14 LAB — CBC
HEMATOCRIT: 39.3 % (ref 39.0–52.0)
Hemoglobin: 13.4 g/dL (ref 13.0–17.0)
MCH: 29.4 pg (ref 26.0–34.0)
MCHC: 34.1 g/dL (ref 30.0–36.0)
MCV: 86.2 fL (ref 78.0–100.0)
Platelets: 172 10*3/uL (ref 150–400)
RBC: 4.56 MIL/uL (ref 4.22–5.81)
RDW: 12.6 % (ref 11.5–15.5)
WBC: 7.3 10*3/uL (ref 4.0–10.5)

## 2016-09-14 LAB — GLUCOSE, CAPILLARY
GLUCOSE-CAPILLARY: 276 mg/dL — AB (ref 65–99)
Glucose-Capillary: 300 mg/dL — ABNORMAL HIGH (ref 65–99)
Glucose-Capillary: 267 mg/dL — ABNORMAL HIGH (ref 65–99)
Glucose-Capillary: 227 mg/dL — ABNORMAL HIGH (ref 65–99)

## 2016-09-14 LAB — BASIC METABOLIC PANEL
ANION GAP: 11 (ref 5–15)
BUN: 13 mg/dL (ref 6–20)
CALCIUM: 9 mg/dL (ref 8.9–10.3)
CO2: 24 mmol/L (ref 22–32)
Chloride: 93 mmol/L — ABNORMAL LOW (ref 101–111)
Creatinine, Ser: 0.66 mg/dL (ref 0.61–1.24)
GLUCOSE: 287 mg/dL — AB (ref 65–99)
POTASSIUM: 4 mmol/L (ref 3.5–5.1)
Sodium: 128 mmol/L — ABNORMAL LOW (ref 135–145)

## 2016-09-14 MED ORDER — IPRATROPIUM-ALBUTEROL 0.5-2.5 (3) MG/3ML IN SOLN
3.0000 mL | Freq: Four times a day (QID) | RESPIRATORY_TRACT | Status: DC
  Administered 2016-09-14 – 2016-09-18 (×13): 3 mL via RESPIRATORY_TRACT
  Filled 2016-09-14 (×14): qty 3

## 2016-09-14 MED ORDER — TRAMADOL HCL 50 MG PO TABS
100.0000 mg | ORAL_TABLET | Freq: Four times a day (QID) | ORAL | Status: DC
  Administered 2016-09-14 – 2016-09-19 (×19): 100 mg via ORAL
  Filled 2016-09-14 (×19): qty 2

## 2016-09-14 MED ORDER — INSULIN GLARGINE 100 UNIT/ML ~~LOC~~ SOLN
30.0000 [IU] | Freq: Every day | SUBCUTANEOUS | Status: DC
  Administered 2016-09-14: 30 [IU] via SUBCUTANEOUS
  Filled 2016-09-14 (×3): qty 0.3

## 2016-09-14 MED ORDER — IOPAMIDOL (ISOVUE-300) INJECTION 61%
INTRAVENOUS | Status: AC
  Administered 2016-09-14: 100 mL
  Filled 2016-09-14: qty 100

## 2016-09-14 MED ORDER — HYDRALAZINE HCL 25 MG PO TABS
25.0000 mg | ORAL_TABLET | Freq: Four times a day (QID) | ORAL | Status: DC
  Administered 2016-09-14 – 2016-09-19 (×20): 25 mg via ORAL
  Filled 2016-09-14 (×20): qty 1

## 2016-09-14 MED ORDER — LOSARTAN POTASSIUM 50 MG PO TABS
100.0000 mg | ORAL_TABLET | Freq: Every day | ORAL | Status: DC
  Administered 2016-09-14 – 2016-09-19 (×6): 100 mg via ORAL
  Filled 2016-09-14 (×6): qty 2

## 2016-09-14 MED ORDER — NAPROXEN 250 MG PO TABS
500.0000 mg | ORAL_TABLET | Freq: Two times a day (BID) | ORAL | Status: DC
  Administered 2016-09-14 – 2016-09-19 (×10): 500 mg via ORAL
  Filled 2016-09-14 (×10): qty 2

## 2016-09-14 MED ORDER — IOPAMIDOL (ISOVUE-300) INJECTION 61%
INTRAVENOUS | Status: AC
  Filled 2016-09-14: qty 100

## 2016-09-14 MED ORDER — OXYCODONE HCL 5 MG PO TABS
10.0000 mg | ORAL_TABLET | ORAL | Status: DC | PRN
  Administered 2016-09-14 – 2016-09-18 (×17): 20 mg via ORAL
  Filled 2016-09-14 (×17): qty 4

## 2016-09-14 NOTE — Progress Notes (Signed)
OT Cancellation Note  Patient Details Name: Jerry Morgan MRN: [id number] DOB: 1961-03-13   Cancelled Treatment:    Reason Eval/Treat Not Completed: Pain limiting ability to participate Pt educated on importance and benefits of mobility and OOB, Pt stated that he finally has pain under control and will participate in future sessions tomorrow.  [place] 09/14/2016, 4:46 PM  Hulda Humphrey OTR/L [phone]

## 2016-09-14 NOTE — Progress Notes (Signed)
Patient ID: Jerry Morgan, male   DOB: 20-Sep-1961, 55 y.o.   MRN: [id number]   LOS: 1 day   Subjective: Still having significant pain, requiring frequent IV breakthrough meds. Both HTN and DM are not well controlled.   Objective: Vital signs in last 24 hours: Temp:  [98.3 F (36.8 C)-98.6 F (37 C)] 98.3 F (36.8 C) (12/15 0535) Pulse Rate:  [76-96] 90 (12/15 0535) Resp:  [18-20] 18 (12/15 0535) BP: (165-218)/(82-107) 174/85 (12/15 0535) SpO2:  [92 %-99 %] 92 % (12/15 0855) Last BM Date: 09/12/16   IS: 1529ml   Laboratory  CBC  Recent Labs  09/12/16 1852 09/14/16 0617  WBC 10.2 7.3  HGB 14.0 13.4  HCT 40.9 39.3  PLT 200 172   BMET  Recent Labs  09/12/16 2148 09/14/16 0617  NA 132* 128*  K 4.0 4.0  CL 101 93*  CO2 18* 24  GLUCOSE 293* 287*  BUN 16 13  CREATININE 0.57* 0.66  CALCIUM 8.5* 9.0   CBG (last 3)   Recent Labs  09/13/16 1652 09/13/16 2120 09/14/16 0734  GLUCAP 257* 271* 300*    Physical Exam General appearance: alert and no distress Resp: wheezes bilaterally and worse on left Cardio: Mild tachycardia GI: normal findings: bowel sounds normal and soft, non-tender Pulses: 2+ and symmetric   Assessment/Plan: Blunt chest trauma Multiple left rib fxs -- Pulmonary toilet LAD -- For CT abd/pelvis this AM Urinary retention -- Urecholine/Flomax, may be able to wean urecholine DM -- On Lantus + SSI, increase Lantus HTN -- Increase hydralazine and Cozaar FEN -- Increase OxyIR, add NSAID VTE -- SCD's, Lovenox Dispo -- Pain control    Lisette Abu, PA-C Pager: [phone] General Trauma PA Pager: [phone]  09/14/2016

## 2016-09-14 NOTE — Progress Notes (Signed)
Inpatient Diabetes Program Recommendations  AACE/ADA: New Consensus Statement on Inpatient Glycemic Control (2015)  Target Ranges:  Prepandial:   less than 140 mg/dL      Peak postprandial:   less than 180 mg/dL (1-2 hours)      Critically ill patients:  140 - 180 mg/dL   Lab Results  Component Value Date   GLUCAP 276 (H) 09/14/2016   HGBA1C 11.1 (H) 09/04/2016    Review of Glycemic Control  Results for KEVONDRE, NIELD (MRN [id number]) as of 09/14/2016 12:02  Ref. Range 09/13/2016 11:43 09/13/2016 16:52 09/13/2016 21:20 09/14/2016 07:34 09/14/2016 11:43  Glucose-Capillary Latest Ref Range: 65 - 99 mg/dL 254 (H) 257 (H) 271 (H) 300 (H) 276 (H)    Diabetes history: Type 2 Outpatient Diabetes medications: Metformin 500mg  bid Current orders for Inpatient glycemic control: Lantus 30 unitsa qhs, Novolog 0-15 units tid  Inpatient Diabetes Program Recommendations:  A1C is 11.1% and fasting blood sugar 287mg /dl;  Lantus 21 units yesterday and increased to Lantus 30 units qday (0.2 units/kg) today.   Consider adding Novolog 0-5 units qhs.  Gentry Fitz, RN, BA, MHA, CDE Diabetes Coordinator Inpatient Diabetes Program  [phone] (Team Pager) [phone] ([place]) 09/14/2016 12:16 PM

## 2016-09-14 NOTE — Progress Notes (Addendum)
Pt refused to get CT scan at this time, pt wants to sleep. Pt requests CT scan to be done in the morning instead.

## 2016-09-15 LAB — GLUCOSE, CAPILLARY
GLUCOSE-CAPILLARY: 366 mg/dL — AB (ref 65–99)
GLUCOSE-CAPILLARY: 223 mg/dL — AB (ref 65–99)
Glucose-Capillary: 286 mg/dL — ABNORMAL HIGH (ref 65–99)
Glucose-Capillary: 246 mg/dL — ABNORMAL HIGH (ref 65–99)

## 2016-09-15 MED ORDER — INSULIN GLARGINE 100 UNIT/ML ~~LOC~~ SOLN: 40.0000 [IU] | Freq: Every day | SUBCUTANEOUS | Status: DC

## 2016-09-15 MED ORDER — INSULIN GLARGINE 100 UNIT/ML ~~LOC~~ SOLN
40.0000 [IU] | Freq: Every day | SUBCUTANEOUS | Status: DC
  Administered 2016-09-15 – 2016-09-16 (×2): 40 [IU] via SUBCUTANEOUS
  Filled 2016-09-15 (×2): qty 0.4

## 2016-09-15 NOTE — Progress Notes (Signed)
Started pt on flutter valve to help with cough.  Pt in pain just got back from the bathroom.  Stressed the importance of using flutter hourly.

## 2016-09-15 NOTE — Progress Notes (Signed)
Subjective: Alert and cooperative.  Stable. Trying to cough and use incentive spirometer, but fair amount of left-sided chest wall pain. Afebrile.  Labs yesterday showed WBC 7300, hemoglobin 13.4, sodium 128.  Chloride 93.  Potassium 4.0. CBGs still high, 227 - 286 range.  Chest x-ray shows atelectasis but no consolidation CT confirms that the spleen is intact.  Left lower lobe atelectasis versus consolidation.  No pneumothorax.  Lymphadenopathy in upper abdomen of uncertain significance.  Three-month follow-up recommended  Objective: Vital signs in last 24 hours: Temp:  [97.7 F (36.5 C)-98.7 F (37.1 C)] 98.6 F (37 C) (12/16 0906) Pulse Rate:  [81-100] 81 (12/16 0906) Resp:  [17-18] 18 (12/16 0906) BP: (121-187)/(68-93) 121/72 (12/16 0906) SpO2:  [89 %-98 %] 91 % (12/16 0958) Last BM Date: 09/12/16  Intake/Output from previous day: 12/15 0701 - 12/16 0700 In: 580 [P.O.:580] Out: 470 [Urine:470] Intake/Output this shift: No intake/output data recorded.  General appearance: Alert and cooperative.  Mental status normal.  Skin warm and dry.  Large man. Resp: Coarse breath sounds bilaterally.  No wheeze. GI: soft, non-tender; bowel sounds normal; no masses,  no organomegaly  Lab Results:   Recent Labs  09/12/16 1852 09/14/16 0617  WBC 10.2 7.3  HGB 14.0 13.4  HCT 40.9 39.3  PLT 200 172   BMET  Recent Labs  09/12/16 2148 09/14/16 0617  NA 132* 128*  K 4.0 4.0  CL 101 93*  CO2 18* 24  GLUCOSE 293* 287*  BUN 16 13  CREATININE 0.57* 0.66  CALCIUM 8.5* 9.0   PT/INR No results for input(s): LABPROT, INR in the last 72 hours. ABG No results for input(s): PHART, HCO3 in the last 72 hours.  Invalid input(s): PCO2, PO2  Studies/Results: Ct Abdomen Pelvis W Contrast  Result Date: 09/14/2016 CLINICAL DATA:  Fall several days ago with persistent left upper quadrant pain, initial encounter EXAM: CT ABDOMEN AND PELVIS WITH CONTRAST TECHNIQUE: Multidetector CT  imaging of the abdomen and pelvis was performed using the standard protocol following bolus administration of intravenous contrast. CONTRAST:  171mL ISOVUE-300 IOPAMIDOL (ISOVUE-300) INJECTION 61% COMPARISON:  09/13/2016 FINDINGS: Lower chest: Bilateral basilar consolidation is noted slightly greater on the right than the left. A small right-sided pleural effusion is noted. No pneumothorax is seen. Hepatobiliary: Diffuse fatty infiltration of the liver is noted. The gallbladder demonstrates evidence of vicarious excretion of contrast material. Pancreas: Unremarkable. No pancreatic ductal dilatation or surrounding inflammatory changes. Spleen: No splenic injury or perisplenic hematoma. Adrenals/Urinary Tract: The right adrenal gland is within normal limits. The fullness of the left adrenal gland seen on the prior exam is stable. The kidneys demonstrate normal excretion a an enhancement. The bladder is well distended. Tiny cyst is noted within the right kidney. Stomach/Bowel: The appendix is within normal limits. No obstructive changes are seen. No inflammatory changes are noted. Vascular/Lymphatic: Aortic atherosclerosis. Scattered small retroperitoneal lymph nodes are seen. The largest of these lies in the aortocaval space measuring 9.9 mm. It demonstrates a fatty hilus. A 15 mm gastrohepatic lymph node is again identified and stable in appearance. Some peripancreatic lymph nodes are seen best noted on the coronal imaging from image 73 to 85 of series 5. Reproductive: Prostate is unremarkable. Other: No abdominal wall hernia or abnormality. No abdominopelvic ascites. Musculoskeletal: There again noted multiple left-sided rib fractures. Old healed right rib fractures are noted. IMPRESSION: Increase in the degree of bilateral lower lobe consolidation with small left pleural effusion. Again no pneumothorax is identified. No evidence  of splenic injury. Lymphadenopathy is noted within the upper abdomen of uncertain  significance. Short-term followup in 3 months is recommended to assess for stability. Stable left rib fractures Electronically Signed   By: Inez Catalina M.D.   On: 09/14/2016 09:58   Dg Chest Port 1 View  Result Date: 09/14/2016 CLINICAL DATA:  Acute respiratory failure EXAM: PORTABLE CHEST 1 VIEW COMPARISON:  September 12, 2016 chest radiograph and chest CT September 13, 2016 FINDINGS: Multiple rib fractures are apparent on the left, better seen by CT. No pneumothorax. There is mild atelectasis in the base regions. Lungs elsewhere clear. Heart is upper normal in size with pulmonary vascularity within normal limits. No adenopathy. IMPRESSION: Several rib fractures on left. No pneumothorax. Mild bibasilar atelectasis. Stable cardiac silhouette. Electronically Signed   By: Lowella Grip III M.D.   On: 09/14/2016 07:59    Anti-infectives: Anti-infectives    None      Assessment/Plan:  Blunt chest trauma Multiple left rib fxs -- Pulmonary toiletAmbulate more.  PT consult  LAD -- no evidence of splenic injury.  Left lung atelectasis and probable contusion from rib fractures encourage pulmonary toilet Urinary retention -- Urecholine/Flomax, may be able to wean urecholine DM -- On Lantus + SSI, increase Lantus again to 40 HTN -- Increase hydralazine and Cozaar FEN -- Increase OxyIR, add NSAID VTE -- SCD's, Lovenox Dispo -- Pain control    LOS: 2 days    Juno Alers M 09/15/2016

## 2016-09-16 LAB — BASIC METABOLIC PANEL
Anion gap: 11 (ref 5–15)
BUN: 30 mg/dL — AB (ref 6–20)
CHLORIDE: 91 mmol/L — AB (ref 101–111)
CO2: 24 mmol/L (ref 22–32)
Calcium: 9 mg/dL (ref 8.9–10.3)
Creatinine, Ser: 0.96 mg/dL (ref 0.61–1.24)
GFR calc Af Amer: >60 mL/min (ref >60)
GFR calc non Af Amer: >60 mL/min (ref >60)
GLUCOSE: 214 mg/dL — AB (ref 65–99)
POTASSIUM: 3.7 mmol/L (ref 3.5–5.1)
Sodium: 126 mmol/L — ABNORMAL LOW (ref 135–145)

## 2016-09-16 LAB — GLUCOSE, CAPILLARY
GLUCOSE-CAPILLARY: 210 mg/dL — AB (ref 65–99)
GLUCOSE-CAPILLARY: 232 mg/dL — AB (ref 65–99)
GLUCOSE-CAPILLARY: 207 mg/dL — AB (ref 65–99)
Glucose-Capillary: 291 mg/dL — ABNORMAL HIGH (ref 65–99)

## 2016-09-16 MED ORDER — WHITE PETROLATUM GEL
Status: AC
  Administered 2016-09-16: 11:00:00
  Filled 2016-09-16: qty 1

## 2016-09-16 MED ORDER — METFORMIN HCL 500 MG PO TABS
500.0000 mg | ORAL_TABLET | Freq: Two times a day (BID) | ORAL | Status: DC
  Administered 2016-09-16 – 2016-09-19 (×6): 500 mg via ORAL
  Filled 2016-09-16 (×6): qty 1

## 2016-09-16 MED ORDER — SODIUM CHLORIDE 0.9 % IV SOLN
INTRAVENOUS | Status: DC
  Administered 2016-09-16: 13:00:00 via INTRAVENOUS
  Filled 2016-09-16 (×3): qty 1000

## 2016-09-16 MED ORDER — INSULIN GLARGINE 100 UNIT/ML ~~LOC~~ SOLN
50.0000 [IU] | Freq: Every day | SUBCUTANEOUS | Status: DC
  Filled 2016-09-16: qty 0.5

## 2016-09-16 MED ORDER — POLYETHYLENE GLYCOL 3350 17 G PO PACK
17.0000 g | PACK | Freq: Two times a day (BID) | ORAL | Status: AC
  Administered 2016-09-16 – 2016-09-18 (×4): 17 g via ORAL
  Filled 2016-09-16 (×4): qty 1

## 2016-09-16 NOTE — Progress Notes (Signed)
PT Cancellation Note  Patient Details Name: Jerry Morgan MRN: [id number] DOB: [birth year]   Cancelled Treatment:    Reason Eval/Treat Not Completed: Patient declined. Pt was OOB with OT earlier today and per RN has been walking to and from the bathroom multiple times today. Pt is sore and requesting no therapy this afternoon.    Scheryl Marten PT, DPT  [phone]  09/16/2016, 3:40 PM

## 2016-09-16 NOTE — Progress Notes (Signed)
  Subjective: Says he's feeling a little bit better. Ambulating in room [place] Working with occupational therapy  Continued sputum production but he says the sputum is light yellow nail and doesn't look as bad  Afebrile.  Heart rate 77.  BP 127/69. CBGs ranged from 210-366.  Coming down a little bit. Creatinine 0.96.  Sodium 126.  Potassium 3.7.  Objective: Vital signs in last 24 hours: Temp:  [97.4 F (36.3 C)-98.4 F (36.9 C)] 97.4 F (36.3 C) (12/17 0900) Pulse Rate:  [77-100] 77 (12/17 0900) Resp:  [18] 18 (12/17 0900) BP: (124-159)/(67-82) 127/69 (12/17 0900) SpO2:  [88 %-95 %] 91 % (12/17 0900) Last BM Date: 09/13/16  Intake/Output from previous day: 12/16 0701 - 12/17 0700 In: 100 [P.O.:100] Out: 600 [Urine:600] Intake/Output this shift: Total I/O In: 120 [P.O.:120] Out: -   EXAM: General appearance: Alert and cooperative.  Mental status normal.  Skin warm and dry.  Large man. Resp: Coarse breath sounds bilaterally.  No wheeze. VC 1250 cc---better GI: soft, non-tender; a little firm bowel sounds normal; no masses,  no organomegaly   Lab Results:   Recent Labs  09/14/16 0617  WBC 7.3  HGB 13.4  HCT 39.3  PLT 172   BMET  Recent Labs  09/14/16 0617 09/16/16 0327  NA 128* 126*  K 4.0 3.7  CL 93* 91*  CO2 24 24  GLUCOSE 287* 214*  BUN 13 30*  CREATININE 0.66 0.96  CALCIUM 9.0 9.0   PT/INR No results for input(s): LABPROT, INR in the last 72 hours. ABG No results for input(s): PHART, HCO3 in the last 72 hours.  Invalid input(s): PCO2, PO2  Studies/Results: No results found.  Anti-infectives: Anti-infectives    None      Assessment/Plan:   Blunt chest trauma Multiple left rib fxs -- Pulmonary toiletAmbulate more.  PT consult.    vital capacity better but still with sputum production    Two-view chest x-ray tomorrow LAD-- no evidence of splenic injury.  Left lung atelectasis and probable contusion from rib fractures  encourage pulmonary toilet Urinary retention-- Urecholine/Flomax, may be able to wean urecholine DM-- On Lantus + SSI, increase Lantus again to 40.  Restart metformin. HTN-- Increase hydralazine and Cozaar.  Now normotensive FEN-- Increase OxyIR, add NSAID, MiraLAX twice a day VTE-- SCD's, Lovenox Dispo-- Pain control   LOS: 3 days    Cherlynn Popiel M 09/16/2016

## 2016-09-16 NOTE — Progress Notes (Signed)
Occupational Therapy Treatment Patient Details Name: Jerry Morgan MRN: [id number] DOB: 08/07/1961 Today's Date: 09/16/2016    History of present illness This 55 y.o. male admitted after being knocked into door frame by his dog.  He sustained Lt posterior rib fractures 5-10, and flail chest segment 5-7, aspiration.  PMH includes:  DM, HTN, adrenal nodule lymphadenopathy   OT comments  Pt making progress towards goals and agreeable to work with therapy today. This session focused on education of importance of mobility as well as toileting, peri care and sink level grooming. Pt continues to benefit from skilled OT in the acute care setting. Next session to focus on AE kit intro and education/practice. Pt and wife very excited about this possibility and way to maximize independence.   Follow Up Recommendations  No OT follow up;Supervision/Assistance - 24 hour    Equipment Recommendations  None recommended by OT    Recommendations for Other Services      Precautions / Restrictions Precautions Precautions: Fall Restrictions Weight Bearing Restrictions: No       Mobility Bed Mobility Overal bed mobility: Needs Assistance Bed Mobility: Supine to Sit Rolling: Mod assist         General bed mobility comments: HOB elevated, physical assist for trunk to elevate  Transfers Overall transfer level: Needs assistance Equipment used: 1 person hand held assist Transfers: Sit to/from Stand Sit to Stand: Mod assist         General transfer comment: Pt able to stand with 1 HHA from OT and he wanted wife to support his back    Balance Overall balance assessment: Needs assistance Sitting-balance support: Feet supported;No upper extremity supported Sitting balance-Leahy Scale: Fair Sitting balance - Comments: EOB with no back support   Standing balance support: No upper extremity supported;During functional activity Standing balance-Leahy Scale: Fair Standing balance comment:  standing at sink for grooming                   ADL Overall ADL's : Needs assistance/impaired     Grooming: Wash/dry hands;Min guard;Standing Grooming Details (indicate cue type and reason): sink level grooming                 Toilet Transfer: Minimal assistance;Ambulation;Comfort height toilet;Grab bars Toilet Transfer Details (indicate cue type and reason): Pt likes to have wife assist as well as therapy Toileting- Clothing Manipulation and Hygiene: Maximal assistance;Sit to/from stand;With caregiver independent assisting Toileting - Clothing Manipulation Details (indicate cue type and reason): underwear, wife assisting     Functional mobility during ADLs: Minimal assistance General ADL Comments: Pt continues to be limited by pain, talked about intro to AE kit and Pt and wife excited about it for next session      Vision                     Perception     Praxis      Cognition   Behavior During Therapy: WFL for tasks assessed/performed Overall Cognitive Status: Within Functional Limits for tasks assessed                       Extremity/Trunk Assessment               Exercises     Shoulder Instructions       General Comments      Pertinent Vitals/ Pain       Pain Assessment: 0-10 Pain Score: 8  Pain Location: Lt   chest  Pain Descriptors / Indicators: Moaning;Grimacing;Guarding;Stabbing;Throbbing Pain Intervention(s): Limited activity within patient's tolerance;Monitored during session;Repositioned  Home Living                                          Prior Functioning/Environment              Frequency  Min 2X/week        Progress Toward Goals  OT Goals(current goals can now be found in the care plan section)  Progress towards OT goals: Progressing toward goals  Acute Rehab OT Goals Patient Stated Goal: to go home and less pain  OT Goal Formulation: With patient Time For Goal Achievement:  09/27/16 Potential to Achieve Goals: Good  Plan Discharge plan remains appropriate    Co-evaluation                 End of Session     Activity Tolerance Patient limited by pain   Patient Left in chair;with call bell/phone within reach;with family/visitor present   Nurse Communication Mobility status        Time: 6945-0388 OT Time Calculation (min): 32 min  Charges: OT General Charges $OT Visit: 1 Procedure OT Treatments $Self Care/Home Management : 23-37 mins  Jaci Carrel 09/16/2016, 5:25 PM  Hulda Humphrey OTR/L [phone]

## 2016-09-17 ENCOUNTER — Inpatient Hospital Stay (HOSPITAL_COMMUNITY): Payer: 59

## 2016-09-17 LAB — COMPREHENSIVE METABOLIC PANEL
ALT: 63 U/L (ref 17–63)
AST: 46 U/L — ABNORMAL HIGH (ref 15–41)
Albumin: 3.3 g/dL — ABNORMAL LOW (ref 3.5–5.0)
Alkaline Phosphatase: 80 U/L (ref 38–126)
Anion gap: 12 (ref 5–15)
BILIRUBIN TOTAL: 1.6 mg/dL — AB (ref 0.3–1.2)
BUN: 28 mg/dL — AB (ref 6–20)
CALCIUM: 9 mg/dL (ref 8.9–10.3)
CHLORIDE: 94 mmol/L — AB (ref 101–111)
CO2: 22 mmol/L (ref 22–32)
CREATININE: 0.71 mg/dL (ref 0.61–1.24)
Glucose, Bld: 255 mg/dL — ABNORMAL HIGH (ref 65–99)
Potassium: 4.1 mmol/L (ref 3.5–5.1)
Sodium: 128 mmol/L — ABNORMAL LOW (ref 135–145)
TOTAL PROTEIN: 7.3 g/dL (ref 6.5–8.1)

## 2016-09-17 LAB — GLUCOSE, CAPILLARY
GLUCOSE-CAPILLARY: 273 mg/dL — AB (ref 65–99)
GLUCOSE-CAPILLARY: 203 mg/dL — AB (ref 65–99)
Glucose-Capillary: 201 mg/dL — ABNORMAL HIGH (ref 65–99)
Glucose-Capillary: 165 mg/dL — ABNORMAL HIGH (ref 65–99)

## 2016-09-17 LAB — CBC
HEMATOCRIT: 38.5 % — AB (ref 39.0–52.0)
Hemoglobin: 13.3 g/dL (ref 13.0–17.0)
MCH: 30 pg (ref 26.0–34.0)
MCHC: 34.5 g/dL (ref 30.0–36.0)
MCV: 86.7 fL (ref 78.0–100.0)
PLATELETS: 207 10*3/uL (ref 150–400)
RBC: 4.44 MIL/uL (ref 4.22–5.81)
RDW: 12.7 % (ref 11.5–15.5)
WBC: 7.3 10*3/uL (ref 4.0–10.5)

## 2016-09-17 MED ORDER — BETHANECHOL CHLORIDE 25 MG PO TABS
25.0000 mg | ORAL_TABLET | Freq: Three times a day (TID) | ORAL | Status: DC
  Administered 2016-09-17 (×3): 25 mg via ORAL
  Filled 2016-09-17 (×3): qty 1

## 2016-09-17 MED ORDER — ONDANSETRON HCL 4 MG PO TABS: 4.0000 mg | ORAL_TABLET | ORAL | Status: DC | PRN

## 2016-09-17 MED ORDER — ONDANSETRON HCL 4 MG/2ML IJ SOLN
4.0000 mg | Freq: Four times a day (QID) | INTRAMUSCULAR | Status: DC | PRN
  Administered 2016-09-17 – 2016-09-18 (×2): 4 mg via INTRAVENOUS
  Filled 2016-09-17 (×2): qty 2

## 2016-09-17 MED ORDER — INSULIN GLARGINE 100 UNIT/ML ~~LOC~~ SOLN
60.0000 [IU] | Freq: Every day | SUBCUTANEOUS | Status: DC
  Administered 2016-09-17: 60 [IU] via SUBCUTANEOUS
  Filled 2016-09-17 (×2): qty 0.6

## 2016-09-17 NOTE — Progress Notes (Signed)
PT Cancellation Note  Patient Details Name: Facundo Lantis MRN: [id number] DOB: 11/01/1960   Cancelled Treatment:     Pt reports he has been up moving today around room and in bathroom and he is not interested in mobilizing at this time.  PTA educated on the benefits of mobility and patient remains to refuse.  Pt reports am works best.  Will reattempt tx at a later date when patient is willing.     Weronika Birch Eli Hose 09/17/2016, 3:20 PM  Governor [place], PTA pager [phone]

## 2016-09-17 NOTE — Progress Notes (Signed)
Occupational Therapy Treatment Patient Details Name: Jerry Morgan MRN: [id number] DOB: [birth year] Today's Date: 09/17/2016    History of present illness This 55 y.o. male admitted after being knocked into door frame by his dog.  He sustained Lt posterior rib fractures 5-10, and flail chest segment 5-7, aspiration.  PMH includes:  DM, HTN, adrenal nodule lymphadenopathy   OT comments  This 55 yo male admitted with above seen today for education on use of AE for LB ADLs. All education completed from this standpoint and pt/wife feel they can manage his basic ADLs at home. No further OT needs, we will sign off.   Follow Up Recommendations  No OT follow up;Supervision/Assistance - 24 hour    Equipment Recommendations  None recommended by OT       Precautions / Restrictions Precautions Precautions: Fall Restrictions Weight Bearing Restrictions: No       Mobility Bed Mobility               General bed mobility comments: Pt up in recliner upon my arrival         ADL Overall ADL's : Needs assistance/impaired                                       General ADL Comments: Wife purchased AE kit yesterday. I went over with pt and wife how to use reacher and sock aide for socks and underwear. Pt and wife report that pt does not wear socks pretty much ever. We discussed that they have a walkin shower with grab bar and hand held shower and they feel this will not be an issue to manage they also have a raised toilet.                Cognition   Behavior During Therapy: WFL for tasks assessed/performed Overall Cognitive Status: Within Functional Limits for tasks assessed                                    Pertinent Vitals/ Pain       Pain Assessment: 0-10 Pain Score: 8  Pain Location: left chest Pain Descriptors / Indicators: Guarding;Grimacing Pain Intervention(s): Limited activity within patient's tolerance;Monitored during  session;Repositioned;Patient requesting pain meds-RN notified         Frequency  Min 2X/week        Progress Toward Goals  OT Goals(current goals can now be found in the care plan section)  Progress towards OT goals: Progressing toward goals     Plan Other (comment) (education completed)       End of Session Equipment Utilized During Treatment:  (AE)   Activity Tolerance Patient limited by pain (but was able to perform activities with AE)   Patient Left in chair;with call bell/phone within reach;with family/visitor present   Nurse Communication Patient requests pain meds        Time: 1315-1330 OT Time Calculation (min): 15 min  Charges: OT General Charges $OT Visit: 1 Procedure OT Treatments $Self Care/Home Management : 8-22 mins  Almon Register [phone] 09/17/2016, 2:37 PM

## 2016-09-17 NOTE — Progress Notes (Signed)
PT Cancellation Note  Patient Details Name: Jerry Morgan MRN: [id number] DOB: [birth year]   Cancelled Treatment:    Reason Eval/Treat Not Completed: Patient declined, no reason specified Pt declined mobility due to c/o pain. Not time for pain medication yet. PT will check on pt later as time allows.    Sal[place] April, PTA Pager: [phone]   09/17/2016, 9:53 AM

## 2016-09-17 NOTE — Progress Notes (Signed)
Patient ID: Jerry Morgan, male   DOB: 1961/01/16, 55 y.o.   MRN: [id number]   LOS: 4 days   Subjective: Having a rough morning, tried to have BM and then became distended   Objective: Vital signs in last 24 hours: Temp:  [97.6 F (36.4 C)-98 F (36.7 C)] 98 F (36.7 C) (12/18 0527) Pulse Rate:  [79-85] 81 (12/18 0527) Resp:  [18-20] 20 (12/18 0527) BP: (113-139)/(72-76) 139/76 (12/18 0527) SpO2:  [91 %-97 %] 95 % (12/18 0527) Last BM Date: 09/17/16   Laboratory  CBC  Recent Labs  09/17/16 0750  WBC 7.3  HGB 13.3  HCT 38.5*  PLT 207   CBG (last 3)   Recent Labs  09/16/16 1726 09/16/16 2142 09/17/16 0803  GLUCAP 232* 207* 273*    Radiology Results CHEST  2 VIEW  COMPARISON:  Chest x-ray of September 14, 2016 and chest CT scan of September 13, 2016.  FINDINGS: The radiograph is obtained in a more lordotic projection than yesterday's study. The right hemidiaphragm remains higher than the left. The right lung is clear where aerated. On the left there is basilar subsegmental atelectasis. There is no pneumothorax. Known left fourth through tenth rib fractures are only partially imaged on today's study. Pleural fluid blunts the posterior costophrenic angles bilaterally.  IMPRESSION: Persistent bilateral hypoinflation. Left basilar subsegmental atelectasis. Small bilateral pleural effusions posteriorly. Cardiomegaly without pulmonary edema.  Thoracic aortic atherosclerosis.   Electronically Signed   By: David  Martinique M.D.   On: 09/17/2016 07:57   Physical Exam General appearance: alert and no distress Resp: rhonchi bilaterally Cardio: regular rate and rhythm GI: Soft, NT, tympanic BS, moderate distension Pulses: 2+ and symmetric   Assessment/Plan: Blunt chest trauma Multiple left rib fxs -- Pulmonary toilet LAD -- OP f/u Urinary retention -- Urecholine/Flomax, wean urecholine DM -- On Lantus + SSI, increase Lantus HTN --  Improved FEN -- Check abd x-ray with new distension VTE -- SCD's, Lovenox Dispo -- Pain control    Lisette Abu, PA-C Pager: [phone] General Trauma PA Pager: [phone]  09/17/2016

## 2016-09-18 LAB — CULTURE, BLOOD (ROUTINE X 2)
CULTURE: NO GROWTH
CULTURE: NO GROWTH

## 2016-09-18 LAB — GLUCOSE, CAPILLARY
GLUCOSE-CAPILLARY: 226 mg/dL — AB (ref 65–99)
GLUCOSE-CAPILLARY: 246 mg/dL — AB (ref 65–99)
GLUCOSE-CAPILLARY: 146 mg/dL — AB (ref 65–99)
Glucose-Capillary: 156 mg/dL — ABNORMAL HIGH (ref 65–99)

## 2016-09-18 MED ORDER — BETHANECHOL CHLORIDE 10 MG PO TABS
10.0000 mg | ORAL_TABLET | Freq: Four times a day (QID) | ORAL | Status: DC
  Administered 2016-09-18 – 2016-09-19 (×5): 10 mg via ORAL
  Filled 2016-09-18 (×6): qty 1

## 2016-09-18 MED ORDER — IPRATROPIUM-ALBUTEROL 0.5-2.5 (3) MG/3ML IN SOLN: 3.0000 mL | RESPIRATORY_TRACT | Status: DC | PRN

## 2016-09-18 MED ORDER — INSULIN GLARGINE 100 UNIT/ML ~~LOC~~ SOLN
65.0000 [IU] | Freq: Every day | SUBCUTANEOUS | Status: DC
  Administered 2016-09-18 – 2016-09-19 (×2): 65 [IU] via SUBCUTANEOUS
  Filled 2016-09-18 (×2): qty 0.65

## 2016-09-18 MED ORDER — ONDANSETRON HCL 4 MG PO TABS: 4.0000 mg | ORAL_TABLET | ORAL | Status: DC | PRN

## 2016-09-18 MED ORDER — ONDANSETRON HCL 4 MG/2ML IJ SOLN
4.0000 mg | INTRAMUSCULAR | Status: DC | PRN
  Administered 2016-09-18 – 2016-09-19 (×3): 4 mg via INTRAVENOUS
  Filled 2016-09-18 (×3): qty 2

## 2016-09-18 NOTE — Progress Notes (Signed)
RT Note: Rt paged Trauma PA, Legrand Como about weaning patients nebulizer's. He agrees they can be weaned and he wants to make them PRN. Rt will continue to monitor and assist as needed.

## 2016-09-18 NOTE — Progress Notes (Signed)
Physical Therapy Treatment Patient Details Name: Jerry Morgan MRN: [id number] DOB: [birth year] Today's Date: 09/18/2016    History of Present Illness 55 y/o male with PMH significant for DM and HTN admitted following a fall with multiple L rib fxs.      PT Comments    Patient is progressing toward mobility goals and tolerated stair training this session. Discussed activity progression with pt and wife and encouraged increased OOB mobility. Current plan remains appropriate.   Follow Up Recommendations  No PT follow up     Equipment Recommendations  None recommended by PT    Recommendations for Other Services       Precautions / Restrictions Precautions Precautions: Fall Restrictions Weight Bearing Restrictions: No    Mobility  Bed Mobility Overal bed mobility: Needs Assistance Bed Mobility: Supine to Sit     Supine to sit: Min assist;HOB elevated     General bed mobility comments: assist to elevate trunk into sitting; HOB elevated  Transfers Overall transfer level: Needs assistance Equipment used: Rolling walker (2 wheeled) Transfers: Sit to/from Stand Sit to Stand: Min guard         General transfer comment: min guard for safety; no physical assist required; cues for safe hand placement  Ambulation/Gait Ambulation/Gait assistance: Min guard Ambulation Distance (Feet): 100 Feet Assistive device: None Gait Pattern/deviations: Step-through pattern;Decreased stride length     General Gait Details: cues for increased stride length; slow, steady gait but very guarded   Stairs Stairs: Yes   Stair Management: No rails;Forwards;Step to pattern Number of Stairs: 3 General stair comments: cues for sequencing and step to pattern; assist to steady with HHA   Wheelchair Mobility    Modified Rankin (Stroke Patients Only)       Balance     Sitting balance-Leahy Scale: Good       Standing balance-Leahy Scale: Fair                      Cognition Arousal/Alertness: Awake/alert Behavior During Therapy: WFL for tasks assessed/performed Overall Cognitive Status: Within Functional Limits for tasks assessed                      Exercises      General Comments General comments (skin integrity, edema, etc.): wife present for session      Pertinent Vitals/Pain Pain Assessment: Faces Faces Pain Scale: Hurts little more Pain Location: L ribs Pain Descriptors / Indicators: Grimacing;Guarding;Moaning Pain Intervention(s): Limited activity within patient's tolerance;Monitored during session;Premedicated before session;Repositioned    Home Living                      Prior Function            PT Goals (current goals can now be found in the care plan section) Acute Rehab PT Goals Patient Stated Goal: to go home and less pain  Progress towards PT goals: Progressing toward goals    Frequency    Min 3X/week      PT Plan Current plan remains appropriate    Co-evaluation             End of Session   Activity Tolerance: Patient tolerated treatment well Patient left: with call bell/phone within reach;with family/visitor present;in chair     Time: [id number] PT Time Calculation (min) (ACUTE ONLY): 15 min  Charges:  $Gait Training: 8-22 mins  G Codes:      Salina April, PTA Pager: [phone]   09/18/2016, 11:45 AM

## 2016-09-18 NOTE — Progress Notes (Signed)
Patient ID: Jerry Morgan, male   DOB: [birth year], 55 y.o.   MRN: [id number]   LOS: 5 days   Subjective: Feeling better. Some nausea overnight but no emesis. Had better BM this morning but still runny.   Objective: Vital signs in last 24 hours: Temp:  [97.3 F (36.3 C)-98.6 F (37 C)] 97.3 F (36.3 C) (12/19 0626) Pulse Rate:  [79-89] 88 (12/19 0626) Resp:  [16-19] 18 (12/19 0626) BP: (132-158)/(75-90) 158/90 (12/19 0626) SpO2:  [93 %-98 %] 93 % (12/19 0626) Last BM Date: 09/17/16   Laboratory  CBC  Recent Labs  09/17/16 0750  WBC 7.3  HGB 13.3  HCT 38.5*  PLT 207   BMET  Recent Labs  09/16/16 0327 09/17/16 0750  NA 126* 128*  K 3.7 4.1  CL 91* 94*  CO2 24 22  GLUCOSE 214* 255*  BUN 30* 28*  CREATININE 0.96 0.71  CALCIUM 9.0 9.0   CBG (last 3)   Recent Labs  09/17/16 1702 09/17/16 2106 09/18/16 0755  GLUCAP 203* 165* 226*    Physical Exam General appearance: alert and no distress Resp: clear to auscultation bilaterally Cardio: regular rate and rhythm GI: Soft, diminished, high-pitched BS, mild TTP Pulses: 2+ and symmetric   Assessment/Plan: Blunt chest trauma Multiple left rib fxs -- Pulmonary toilet LAD-- OP f/u Urinary retention-- Urecholine/Flomax, wean urecholine DM-- On Lantus + SSI, increase Lantus HTN-- Improved Partial ileus -- Continue clears today FEN-- No issues VTE-- SCD's, Lovenox Dispo-- Will check this afternoon, plan D/C home if still improving    Lisette Abu, PA-C Pager: [phone] General Trauma PA Pager: [phone]  09/18/2016

## 2016-09-19 LAB — GLUCOSE, CAPILLARY: GLUCOSE-CAPILLARY: 110 mg/dL — AB (ref 65–99)

## 2016-09-19 MED ORDER — LOSARTAN POTASSIUM 100 MG PO TABS: 100.0000 mg | ORAL_TABLET | Freq: Every day | ORAL | 0 refills | Status: DC

## 2016-09-19 MED ORDER — HYDRALAZINE HCL 25 MG PO TABS: 25.0000 mg | ORAL_TABLET | Freq: Four times a day (QID) | ORAL | 0 refills | Status: DC

## 2016-09-19 MED ORDER — NAPROXEN 500 MG PO TABS: 500.0000 mg | ORAL_TABLET | Freq: Two times a day (BID) | ORAL | 1 refills | Status: DC

## 2016-09-19 MED ORDER — TRAMADOL HCL 50 MG PO TABS: 100.0000 mg | ORAL_TABLET | Freq: Four times a day (QID) | ORAL | 0 refills | Status: DC

## 2016-09-19 MED ORDER — INSULIN GLARGINE 100 UNIT/ML ~~LOC~~ SOLN: 65.0000 [IU] | Freq: Every day | SUBCUTANEOUS | 0 refills | Status: DC

## 2016-09-19 MED ORDER — OXYCODONE HCL 10 MG PO TABS: 10.0000 mg | ORAL_TABLET | ORAL | 0 refills | Status: DC | PRN

## 2016-09-19 NOTE — Discharge Instructions (Signed)
Increase activity as pain allows. ° °No driving while taking oxycodone. °

## 2016-09-19 NOTE — Progress Notes (Signed)
Jerry Morgan to be D/C'd  per MD order. Discussed with the patient and all questions fully answered.  VSS, Skin clean, dry and intact without evidence of skin break down, no evidence of skin tears noted.  IV catheter discontinued intact. Site without signs and symptoms of complications. Dressing and pressure applied.  An After Visit Summary was printed and given to the patient. Patient received prescription.  D/c education completed with patient/family including follow up instructions, medication list, d/c activities limitations if indicated, with other d/c instructions as indicated by MD - patient able to verbalize understanding, all questions fully answered.   Patient instructed to return to ED, call 911, or call MD for any changes in condition.   Patient to be escorted via [place], and D/C home via private auto.

## 2016-09-19 NOTE — Discharge Summary (Signed)
Physician Discharge Summary  Patient ID: Jerry Morgan MRN: [id number] DOB/AGE: 57-31-1962 57 y.o.  Admit date: 09/13/2016 Discharge date: 09/19/2016  Discharge Diagnoses Patient Active Problem List   Diagnosis Date Noted  . Blunt chest trauma 09/14/2016  . HTN (hypertension) 09/14/2016  . DM (diabetes mellitus) ([place]) 09/14/2016  . Lymphadenopathy 09/14/2016  . Urinary retention 09/14/2016  . Multiple closed fractures of ribs of left side 09/13/2016    Consultants None   Procedures None   HPI: Jerry Morgan presented to [facility] Emergency Department after being jerked against a door frame by his son's dog. His son is in the [employer] in [place] and he is keeping the dog while he is deployed. The dog is "a runner" and they live a block away from a busy road. He was trying to keep the dog from getting out and, in doing so, got injured. The patient denied falling or striking his head. The patient was quite dyspneic at [facility] but his shortness of breath was better once he arrived at [facility]. He was evaluated by surgery at [facility] but given his flail chest segment they thought he would be better served at a trauma center. The family requested transfer to [facility] but they were on trauma diversion. He had an incidental finding of thoracic and abdominal lymphadenopathy on CT that will need to be followed by his PCP as an outpatient.    Hospital Course: The patient did not suffer any respiratory compromise from his rib fractures. His pain was controlled on oral medications. His blood pressure and blood sugars were uncontrolled on arrival. He required multiple medication changes to bring both under control. He was mobilized with physical and occupational therapies and did well. He developed some mild urinary retention that responded to medical treatment. He also developed a partial ileus that was still present at the time of discharge but the patient could keep fluids down, felt better, and  insisted on returning home. He was discharged home in good condition.   Allergies as of 09/19/2016      Reactions   Penicillins Hives, Other (See Comments)   Has patient had a PCN reaction causing immediate rash, facial/tongue/throat swelling, SOB or lightheadedness with hypotension: yes Has patient had a PCN reaction causing severe rash involving mucus membranes or skin necrosis: no Has patient had a PCN reaction that required hospitalization no Has patient had a PCN reaction occurring within the last 10 years: no If all of the above answers are "NO", then may proceed with Cephalosporin use.      Medication List    STOP taking these medications   azithromycin 250 MG tablet Commonly known as:  ZITHROMAX Z-PAK   empagliflozin 10 MG Tabs tablet Commonly known as:  JARDIANCE     TAKE these medications   carisoprodol 350 MG tablet Commonly known as:  SOMA Take 1 tablet (350 mg total) by mouth 3 (three) times daily as needed for muscle spasms.   Diclofenac Sodium 3 % Gel Place 1 application onto the skin every 12 (twelve) hours as needed (pain).   esomeprazole 20 MG capsule Commonly known as:  NEXIUM Take 20 mg by mouth daily at 12 noon.   hydrALAZINE 25 MG tablet Commonly known as:  APRESOLINE Take 1 tablet (25 mg total) by mouth 4 (four) times daily. What changed:  when to take this   insulin glargine 100 UNIT/ML injection Commonly known as:  LANTUS Inject 0.65 mLs (65 Units total) into the skin daily. Start taking on:  09/20/2016   losartan 100 MG tablet Commonly known as:  COZAAR Take 1 tablet (100 mg total) by mouth daily. Start taking on:  09/20/2016 What changed:  medication strength  how much to take   metFORMIN 500 MG tablet Commonly known as:  GLUCOPHAGE Take 1 tablet (500 mg total) by mouth 2 (two) times daily with a meal.   naproxen 500 MG tablet Commonly known as:  NAPROSYN Take 1 tablet (500 mg total) by mouth 2 (two) times daily with a meal.    Oxycodone HCl 10 MG Tabs Take 1-2 tablets (10-20 mg total) by mouth every 4 (four) hours as needed (Pain).   traMADol 50 MG tablet Commonly known as:  ULTRAM Take 2 tablets (100 mg total) by mouth every 6 (six) hours.       Follow-up Information    Otilio Miu, MD. Schedule an appointment as soon as possible for a visit.   Specialty:  Family Medicine Contact information: [address] [place] [address] [phone]        [place] Follow up.   Why:  Call as needed Contact information: [address] [id number] [place] [phone]           Signed: Lisette Abu, PA-C Pager: [phone] General Trauma PA Pager: [phone] 09/19/2016, 11:03 AM

## 2016-09-19 NOTE — Progress Notes (Signed)
Patient ID: Jerry Morgan, male   DOB: [birth year], 55 y.o.   MRN: [id number]   LOS: 6 days   Subjective: Nausea + emesis last night but feels better this morning. Had another loose BM. Despite that, insists on going home. Able to keep most liquids down so not worried about dehydration so probably ok.   Objective: Vital signs in last 24 hours: Temp:  [97.7 F (36.5 C)-99 F (37.2 C)] 97.7 F (36.5 C) (12/20 0614) Pulse Rate:  [81-94] 81 (12/20 0614) Resp:  [18-20] 18 (12/20 0614) BP: (125-185)/(74-94) 125/74 (12/20 0930) SpO2:  [94 %-96 %] 94 % (12/20 0614) Last BM Date: 09/18/16   Laboratory Results CBG (last 3)   Recent Labs  09/18/16 1636 09/18/16 2208 09/19/16 0758  GLUCAP 156* 146* 110*    Physical Exam General appearance: alert and no distress Resp: clear to auscultation bilaterally Cardio: regular rate and rhythm GI: normal findings: bowel sounds improved and soft, non-tender Pulses: 2+ and symmetric   Assessment/Plan: Blunt chest trauma Multiple left rib fxs -- Pulmonary toilet LAD-- OP f/u Urinary retention-- Resolved DM-- On Lantus + SSI HTN-- Improved Partial ileus  Dispo-- D/C home    Lisette Abu, PA-C Pager: [phone] General Trauma PA Pager: [phone]  09/19/2016

## 2016-09-19 NOTE — Progress Notes (Signed)
Patient left tooth brush, tooth paste, an hairbrush in room after discharge, called patient to see if he wanted to come pick them up, he said no we could throw them away.

## 2016-09-20 ENCOUNTER — Other Ambulatory Visit: Payer: Self-pay

## 2016-09-27 ENCOUNTER — Other Ambulatory Visit: Payer: Self-pay

## 2016-09-28 ENCOUNTER — Encounter: Payer: Self-pay | Admitting: Family Medicine

## 2016-09-28 ENCOUNTER — Ambulatory Visit
Admission: RE | Admit: 2016-09-28 | Discharge: 2016-09-28 | Disposition: A | Discharge status: Home / Self Care | Payer: 59 | Source: Ambulatory Visit | Attending: Family Medicine | Admitting: Family Medicine

## 2016-09-28 ENCOUNTER — Ambulatory Visit (INDEPENDENT_AMBULATORY_CARE_PROVIDER_SITE_OTHER): Payer: 59 | Admitting: Family Medicine

## 2016-09-28 VITALS — BP 140/100 | HR 72 | Ht 72.0 in | Wt 230.0 lb

## 2016-09-28 DIAGNOSIS — Z09 Encounter for follow-up examination after completed treatment for conditions other than malignant neoplasm: Secondary | ICD-10-CM

## 2016-09-28 DIAGNOSIS — E119 Type 2 diabetes mellitus without complications: Secondary | ICD-10-CM | POA: Diagnosis not present

## 2016-09-28 DIAGNOSIS — J90 Pleural effusion, not elsewhere classified: Secondary | ICD-10-CM | POA: Insufficient documentation

## 2016-09-28 DIAGNOSIS — S225XXD Flail chest, subsequent encounter for fracture with routine healing: Secondary | ICD-10-CM

## 2016-09-28 DIAGNOSIS — S2242XD Multiple fractures of ribs, left side, subsequent encounter for fracture with routine healing: Secondary | ICD-10-CM | POA: Insufficient documentation

## 2016-09-28 DIAGNOSIS — R590 Localized enlarged lymph nodes: Secondary | ICD-10-CM

## 2016-09-28 DIAGNOSIS — Z794 Long term (current) use of insulin: Secondary | ICD-10-CM

## 2016-09-28 DIAGNOSIS — X58XXXD Exposure to other specified factors, subsequent encounter: Secondary | ICD-10-CM | POA: Diagnosis not present

## 2016-09-28 DIAGNOSIS — R918 Other nonspecific abnormal finding of lung field: Secondary | ICD-10-CM | POA: Insufficient documentation

## 2016-09-28 DIAGNOSIS — I10 Essential (primary) hypertension: Secondary | ICD-10-CM

## 2016-09-28 NOTE — Progress Notes (Addendum)
Name: Jerry Morgan   MRN: [id number]    DOB: [birth year]   Date:09/28/2016       Progress Note  Subjective  Chief Complaint  Chief Complaint  Patient presents with  . Follow-up    d/c 09/20/2016 - fx ribs from a fall- Tramadol made "me nauseated, but I could probably take it if I had to"    Hospital followup for flail chest/     No problem-specific Assessment & Plan notes found for this encounter.   Past Medical History:  Diagnosis Date  . GERD (gastroesophageal reflux disease)   . Hypertension   . Rib fracture 09/12/2016   after being jerked against a door frame by his son's dog.  Pt sustained Lt rib fractures 5-10 with flail chest segment 5-7/notes 09/13/2016  . Type II diabetes mellitus ([place])    dx'd ~ 08/2016    Past Surgical History:  Procedure Laterality Date  . COLONOSCOPY WITH PROPOFOL N/A 02/09/2016   Procedure: COLONOSCOPY WITH PROPOFOL;  Surgeon: Hulen Luster, MD;  Location: [facility] ENDOSCOPY;  Service: Gastroenterology;  Laterality: N/A;  . ESOPHAGOGASTRODUODENOSCOPY (EGD) WITH PROPOFOL N/A 02/09/2016   Procedure: ESOPHAGOGASTRODUODENOSCOPY (EGD) WITH PROPOFOL;  Surgeon: Hulen Luster, MD;  Location: [facility] ENDOSCOPY;  Service: Gastroenterology;  Laterality: N/A;  . JOINT REPLACEMENT  2000s   "joints in big toes"   . KNEE ARTHROSCOPY Left ~ 2010  . TONSILLECTOMY      Family History  Problem Relation Age of Onset  . Diabetes Mother   . Hypertension Mother   . Heart disease Father   . Hypertension Father     Social History   Social History  . Marital status: Married    Spouse name: N/A  . Number of children: N/A  . Years of education: N/A   Occupational History  . Not on file.   Social History Main Topics  . Smoking status: Never Smoker  . Smokeless tobacco: Never Used  . Alcohol use 16.8 oz/week    28 Shots of liquor per week     Comment: 09/13/2016 "2, 2 shot drinks/day"  . Drug use: No  . Sexual activity: Not Currently   Other Topics Concern   . Not on file   Social History Narrative  . No narrative on file    Allergies  Allergen Reactions  . Penicillins Hives and Other (See Comments)    Has patient had a PCN reaction causing immediate rash, facial/tongue/throat swelling, SOB or lightheadedness with hypotension: yes Has patient had a PCN reaction causing severe rash involving mucus membranes or skin necrosis: no Has patient had a PCN reaction that required hospitalization no Has patient had a PCN reaction occurring within the last 10 years: no If all of the above answers are "NO", then may proceed with Cephalosporin use.       Review of Systems  Constitutional: Negative for chills, fever, malaise/fatigue and weight loss.  HENT: Negative for ear discharge, ear pain and sore throat.   Eyes: Negative for blurred vision.  Respiratory: Negative for cough, sputum production, shortness of breath and wheezing.   Cardiovascular: Negative for chest pain, palpitations and leg swelling.  Gastrointestinal: Negative for abdominal pain, blood in stool, constipation, diarrhea, heartburn, melena and nausea.  Genitourinary: Negative for dysuria, frequency, hematuria and urgency.  Musculoskeletal: Negative for back pain, joint pain, myalgias and neck pain.  Skin: Negative for rash.  Neurological: Negative for dizziness, tingling, sensory change, focal weakness and headaches.  Endo/Heme/Allergies: Negative for environmental allergies  and polydipsia. Does not bruise/bleed easily.  Psychiatric/Behavioral: Negative for depression and suicidal ideas. The patient is not nervous/anxious and does not have insomnia.      Objective  Vitals:   09/28/16 1039 09/28/16 1042  BP: (!) 140/100   Pulse: 72   SpO2:  98%  Weight: 230 lb (104.3 kg)   Height: 6' (1.829 m)     Physical Exam  Constitutional: He is oriented to person, place, and time and well-developed, well-nourished, and in no distress.  HENT:  Head: Normocephalic.  Right Ear:  External ear normal.  Left Ear: External ear normal.  Nose: Nose normal.  Mouth/Throat: Oropharynx is clear and moist.  Eyes: Conjunctivae and EOM are normal. Pupils are equal, round, and reactive to light. Right eye exhibits no discharge. Left eye exhibits no discharge. No scleral icterus.  Neck: Normal range of motion. Neck supple. No JVD present. No tracheal deviation present. No thyromegaly present.  Cardiovascular: Normal rate, regular rhythm, normal heart sounds and intact distal pulses.  Exam reveals no gallop and no friction rub.   No murmur heard. Pulmonary/Chest: Breath sounds normal. No respiratory distress. He has no wheezes. He has no rales. He exhibits tenderness.  Abdominal: Soft. Bowel sounds are normal. He exhibits no mass. There is no hepatosplenomegaly. There is no tenderness. There is no rebound, no guarding and no CVA tenderness.  Musculoskeletal: Normal range of motion. He exhibits no edema or tenderness.  Lymphadenopathy:    He has no cervical adenopathy.  Neurological: He is alert and oriented to person, place, and time. He has normal sensation, normal strength, normal reflexes and intact cranial nerves. No cranial nerve deficit.  Skin: Skin is warm. No rash noted.  Psychiatric: Mood and affect normal.  Nursing note and vitals reviewed.     Assessment & Plan  Problem List Items Addressed This Visit      Cardiovascular and Mediastinum   HTN (hypertension) (Chronic)     Endocrine   DM (diabetes mellitus) ([place]) (Chronic)     Musculoskeletal and Integument   Multiple closed fractures of ribs of left side   Relevant Orders   DG Chest 2 View    Other Visit Diagnoses    Hospital discharge follow-up    -  Primary   Closed fracture of multiple ribs with flail chest with routine healing, subsequent encounter       Relevant Orders   DG Chest 2 View   Lymphadenopathy, abdominal         CALLED HYDROCODONE 5/325MG  to pharmacy # 30 with no refills. Pt is to switch  over to Tramadol after completing Hydrocodone/ and returning for a recheck.   [place] consulted Dr. Otilio Miu [facility]  09/28/16

## 2016-10-05 ENCOUNTER — Other Ambulatory Visit: Payer: Self-pay

## 2016-10-05 ENCOUNTER — Ambulatory Visit: Payer: 59 | Admitting: Family Medicine

## 2016-10-05 MED ORDER — INSULIN GLARGINE 100 UNIT/ML ~~LOC~~ SOLN
32.0000 [IU] | Freq: Two times a day (BID) | SUBCUTANEOUS | 0 refills | Status: DC
Start: 2016-10-05 — End: 2016-10-19

## 2016-10-15 ENCOUNTER — Encounter: Payer: Self-pay | Admitting: Family Medicine

## 2016-10-15 ENCOUNTER — Ambulatory Visit (INDEPENDENT_AMBULATORY_CARE_PROVIDER_SITE_OTHER): Payer: 59 | Admitting: Family Medicine

## 2016-10-15 VITALS — BP 138/80 | HR 84 | Ht 72.0 in | Wt 232.0 lb

## 2016-10-15 DIAGNOSIS — I10 Essential (primary) hypertension: Secondary | ICD-10-CM | POA: Diagnosis not present

## 2016-10-15 DIAGNOSIS — L853 Xerosis cutis: Secondary | ICD-10-CM

## 2016-10-15 DIAGNOSIS — Z794 Long term (current) use of insulin: Secondary | ICD-10-CM | POA: Diagnosis not present

## 2016-10-15 DIAGNOSIS — E119 Type 2 diabetes mellitus without complications: Secondary | ICD-10-CM | POA: Diagnosis not present

## 2016-10-15 NOTE — Progress Notes (Signed)
Name: Jerry Morgan   MRN: [id number]    DOB: 04-22-1961   Date:10/15/2016       Progress Note  Subjective  Chief Complaint  Chief Complaint  Patient presents with  . Diabetes    averaging 150- 160. Has had a couple of 250 readings- all been at night.     Diabetes  He presents for his follow-up diabetic visit. He has type 2 diabetes mellitus. His disease course has been improving. There are no hypoglycemic associated symptoms. Pertinent negatives for hypoglycemia include no dizziness, headaches or nervousness/anxiousness. Associated symptoms include polydipsia. Pertinent negatives for diabetes include no blurred vision, no chest pain, no fatigue, no foot paresthesias, no foot ulcerations, no polyphagia, no polyuria, no visual change, no weakness and no weight loss. There are no hypoglycemic complications. Symptoms are stable. There are no diabetic complications. Risk factors for coronary artery disease include diabetes mellitus.    No problem-specific Assessment & Plan notes found for this encounter.   Past Medical History:  Diagnosis Date  . GERD (gastroesophageal reflux disease)   . Hypertension   . Rib fracture 09/12/2016   after being jerked against a door frame by his son's dog.  Pt sustained Lt rib fractures 5-10 with flail chest segment 5-7/notes 09/13/2016  . Type II diabetes mellitus ([place])    dx'd ~ 08/2016    Past Surgical History:  Procedure Laterality Date  . COLONOSCOPY WITH PROPOFOL N/A 02/09/2016   Procedure: COLONOSCOPY WITH PROPOFOL;  Surgeon: Hulen Luster, MD;  Location: [facility] ENDOSCOPY;  Service: Gastroenterology;  Laterality: N/A;  . ESOPHAGOGASTRODUODENOSCOPY (EGD) WITH PROPOFOL N/A 02/09/2016   Procedure: ESOPHAGOGASTRODUODENOSCOPY (EGD) WITH PROPOFOL;  Surgeon: Hulen Luster, MD;  Location: [facility] ENDOSCOPY;  Service: Gastroenterology;  Laterality: N/A;  . JOINT REPLACEMENT  2000s   "joints in big toes"   . KNEE ARTHROSCOPY Left ~ 2010  . TONSILLECTOMY       Family History  Problem Relation Age of Onset  . Diabetes Mother   . Hypertension Mother   . Heart disease Father   . Hypertension Father     Social History   Social History  . Marital status: Married    Spouse name: N/A  . Number of children: N/A  . Years of education: N/A   Occupational History  . Not on file.   Social History Main Topics  . Smoking status: Never Smoker  . Smokeless tobacco: Never Used  . Alcohol use 16.8 oz/week    28 Shots of liquor per week     Comment: 09/13/2016 "2, 2 shot drinks/day"  . Drug use: No  . Sexual activity: Not Currently   Other Topics Concern  . Not on file   Social History Narrative  . No narrative on file    Allergies  Allergen Reactions  . Penicillins Hives and Other (See Comments)    Has patient had a PCN reaction causing immediate rash, facial/tongue/throat swelling, SOB or lightheadedness with hypotension: yes Has patient had a PCN reaction causing severe rash involving mucus membranes or skin necrosis: no Has patient had a PCN reaction that required hospitalization no Has patient had a PCN reaction occurring within the last 10 years: no If all of the above answers are "NO", then may proceed with Cephalosporin use.       Review of Systems  Constitutional: Negative for chills, fatigue, fever, malaise/fatigue and weight loss.  HENT: Negative for ear discharge, ear pain and sore throat.   Eyes: Negative for blurred  vision.  Respiratory: Negative for cough, sputum production, shortness of breath and wheezing.   Cardiovascular: Negative for chest pain, palpitations and leg swelling.  Gastrointestinal: Negative for abdominal pain, blood in stool, constipation, diarrhea, heartburn, melena and nausea.  Genitourinary: Negative for dysuria, frequency, hematuria and urgency.  Musculoskeletal: Negative for back pain, joint pain, myalgias and neck pain.  Skin: Negative for rash.  Neurological: Negative for dizziness,  tingling, sensory change, focal weakness, weakness and headaches.  Endo/Heme/Allergies: Positive for polydipsia. Negative for environmental allergies and polyphagia. Does not bruise/bleed easily.  Psychiatric/Behavioral: Negative for depression and suicidal ideas. The patient is not nervous/anxious and does not have insomnia.      Objective  Vitals:   10/15/16 0855  BP: 138/80  Pulse: 84  Weight: 232 lb (105.2 kg)  Height: 6' (1.829 m)    Physical Exam  Constitutional: He is oriented to person, place, and time and well-developed, well-nourished, and in no distress.  HENT:  Head: Normocephalic.  Right Ear: External ear normal.  Left Ear: External ear normal.  Nose: Nose normal.  Mouth/Throat: Oropharynx is clear and moist.  Eyes: Conjunctivae and EOM are normal. Pupils are equal, round, and reactive to light. Right eye exhibits no discharge. Left eye exhibits no discharge. No scleral icterus.  Neck: Normal range of motion. Neck supple. No JVD present. No tracheal deviation present. No thyromegaly present.  Cardiovascular: Normal rate, regular rhythm, normal heart sounds and intact distal pulses.  Exam reveals no gallop and no friction rub.   No murmur heard. Pulmonary/Chest: Breath sounds normal. No respiratory distress. He has no wheezes. He has no rales.  Abdominal: Soft. Bowel sounds are normal. He exhibits no mass. There is no hepatosplenomegaly. There is no tenderness. There is no rebound, no guarding and no CVA tenderness.  Musculoskeletal: Normal range of motion. He exhibits no edema or tenderness.  Lymphadenopathy:    He has no cervical adenopathy.  Neurological: He is alert and oriented to person, place, and time. He has normal sensation, normal strength and intact cranial nerves. No cranial nerve deficit.  Skin: Skin is warm. No rash noted.  Psychiatric: Mood and affect normal.      Assessment & Plan  Problem List Items Addressed This Visit      Cardiovascular and  Mediastinum   HTN (hypertension) (Chronic)   Relevant Orders   Renal Function Panel     Endocrine   DM (diabetes mellitus) ([place]) - Primary (Chronic)   Relevant Orders   Hemoglobin A1c   Renal Function Panel   Lipid Profile    Other Visit Diagnoses    Dry skin       Relevant Orders   TSH        Dr. Otilio Miu [facility]  10/15/16

## 2016-10-16 ENCOUNTER — Other Ambulatory Visit: Payer: Self-pay

## 2016-10-16 DIAGNOSIS — E109 Type 1 diabetes mellitus without complications: Secondary | ICD-10-CM

## 2016-10-16 LAB — HEMOGLOBIN A1C
ESTIMATED AVERAGE GLUCOSE: 223 mg/dL
HEMOGLOBIN A1C: 9.4 % — AB (ref 4.8–5.6)

## 2016-10-16 LAB — LIPID PANEL
Chol/HDL Ratio: 4.2 ratio units (ref 0.0–5.0)
Cholesterol, Total: 186 mg/dL (ref 100–199)
HDL: 44 mg/dL (ref >39)
LDL Calculated: 116 mg/dL — ABNORMAL HIGH (ref 0–99)
Triglycerides: 131 mg/dL (ref 0–149)
VLDL CHOLESTEROL CAL: 26 mg/dL (ref 5–40)

## 2016-10-16 LAB — RENAL FUNCTION PANEL
Albumin: 4.5 g/dL (ref 3.5–5.5)
BUN / CREAT RATIO: 29 — AB (ref 9–20)
BUN: 16 mg/dL (ref 6–24)
CALCIUM: 10 mg/dL (ref 8.7–10.2)
CO2: 23 mmol/L (ref 18–29)
CREATININE: 0.55 mg/dL — AB (ref 0.76–1.27)
Chloride: 98 mmol/L (ref 96–106)
GFR calc Af Amer: 134 mL/min/{1.73_m2} (ref >59)
GFR, EST NON AFRICAN AMERICAN: 116 mL/min/{1.73_m2} (ref >59)
Glucose: 88 mg/dL (ref 65–99)
Phosphorus: 4.7 mg/dL — ABNORMAL HIGH (ref 2.5–4.5)
Potassium: 4.4 mmol/L (ref 3.5–5.2)
SODIUM: 138 mmol/L (ref 134–144)

## 2016-10-16 LAB — TSH: TSH: 0.488 u[IU]/mL (ref 0.450–4.500)

## 2016-10-16 MED ORDER — SITAGLIPTIN PHOSPHATE 100 MG PO TABS: 100.0000 mg | ORAL_TABLET | Freq: Every day | ORAL | 0 refills | Status: DC

## 2016-10-19 ENCOUNTER — Other Ambulatory Visit: Payer: Self-pay

## 2016-10-19 DIAGNOSIS — S2242XD Multiple fractures of ribs, left side, subsequent encounter for fracture with routine healing: Secondary | ICD-10-CM

## 2016-10-19 MED ORDER — NAPROXEN 500 MG PO TABS: 500.0000 mg | ORAL_TABLET | Freq: Two times a day (BID) | ORAL | 1 refills | Status: DC

## 2016-10-19 MED ORDER — INSULIN GLARGINE 100 UNIT/ML ~~LOC~~ SOLN: 32.0000 [IU] | Freq: Two times a day (BID) | SUBCUTANEOUS | 0 refills | Status: DC

## 2016-10-19 MED ORDER — LOSARTAN POTASSIUM 100 MG PO TABS: 100.0000 mg | ORAL_TABLET | Freq: Every day | ORAL | 3 refills | Status: DC

## 2016-10-24 ENCOUNTER — Telehealth: Payer: Self-pay

## 2016-10-24 NOTE — Telephone Encounter (Signed)
Called pt with apptwith Dr Maretta Bees (Endo) in [place]- 11/06/16 @ 11:00- told to take all meds with him and arrive 15 min ahead of time

## 2016-10-30 ENCOUNTER — Other Ambulatory Visit: Payer: Self-pay

## 2016-10-30 DIAGNOSIS — E119 Type 2 diabetes mellitus without complications: Secondary | ICD-10-CM

## 2016-10-30 MED ORDER — METFORMIN HCL 500 MG PO TABS: 500.0000 mg | ORAL_TABLET | Freq: Two times a day (BID) | ORAL | 0 refills | Status: DC

## 2016-11-06 DIAGNOSIS — E1165 Type 2 diabetes mellitus with hyperglycemia: Secondary | ICD-10-CM | POA: Diagnosis not present

## 2016-11-06 DIAGNOSIS — Z794 Long term (current) use of insulin: Secondary | ICD-10-CM | POA: Diagnosis not present

## 2016-11-12 ENCOUNTER — Other Ambulatory Visit: Payer: Self-pay

## 2016-11-12 MED ORDER — INSULIN GLARGINE 100 UNIT/ML ~~LOC~~ SOLN: 32.0000 [IU] | Freq: Two times a day (BID) | SUBCUTANEOUS | 0 refills | Status: DC

## 2016-11-13 ENCOUNTER — Other Ambulatory Visit: Payer: Self-pay

## 2016-11-13 ENCOUNTER — Telehealth: Payer: Self-pay

## 2016-11-13 MED ORDER — INSULIN GLARGINE 100 UNIT/ML ~~LOC~~ SOLN: 32.0000 [IU] | Freq: Two times a day (BID) | SUBCUTANEOUS | 0 refills | Status: DC

## 2016-11-13 NOTE — Telephone Encounter (Signed)
Pt called concerning his insulin- I had sent it in by escribe as well as called it into [facility]. Pt seemed aggitated that I "wasn't doing my job"- I called [facility] to ask if they received the med refill request. They had received both of them. It was "too soon for the patient's insulin to be refilled". He got it on the 19th. Then they told me that "He is supposed to be using [facility]". I sent the prescription into [facility] today and called the patient back to explain all this to him. He said, "I will no longer be using your services and I thought you knew everything that went on". Patient hung up on me

## 2016-11-14 ENCOUNTER — Other Ambulatory Visit: Payer: Self-pay | Admitting: Family Medicine

## 2016-11-14 DIAGNOSIS — E113293 Type 2 diabetes mellitus with mild nonproliferative diabetic retinopathy without macular edema, bilateral: Secondary | ICD-10-CM | POA: Diagnosis not present

## 2016-11-14 DIAGNOSIS — I10 Essential (primary) hypertension: Secondary | ICD-10-CM | POA: Diagnosis not present

## 2016-11-14 DIAGNOSIS — H35033 Hypertensive retinopathy, bilateral: Secondary | ICD-10-CM | POA: Diagnosis not present

## 2016-12-12 ENCOUNTER — Ambulatory Visit: Payer: 59 | Admitting: *Deleted

## 2016-12-24 ENCOUNTER — Encounter: Payer: 59 | Attending: Internal Medicine | Admitting: *Deleted

## 2016-12-24 ENCOUNTER — Encounter: Payer: Self-pay | Admitting: *Deleted

## 2016-12-24 VITALS — BP 140/96 | Ht 72.0 in | Wt 238.2 lb

## 2016-12-24 DIAGNOSIS — Z713 Dietary counseling and surveillance: Secondary | ICD-10-CM | POA: Diagnosis present

## 2016-12-24 DIAGNOSIS — E119 Type 2 diabetes mellitus without complications: Secondary | ICD-10-CM | POA: Insufficient documentation

## 2016-12-24 NOTE — Patient Instructions (Signed)
Check blood sugars 2 x day before breakfast and 2 hrs after supper every day  Exercise: Continue walking for   45  minutes   2-3  days a week and gradually increase to 150 minutes/week  Eat 3 meals day, 1-2  snacks a day Space meals 4-6 hours apart Limit fried foods  Bring blood sugar records to the next class  Carry fast acting glucose and a snack at all times Rotate injection sites  Return for classes on:

## 2016-12-24 NOTE — Progress Notes (Signed)
Diabetes Self-Management Education  Visit Type: First/Initial  Appt. Start Time: 0855 Appt. End Time: 8850  12/24/2016  [name], identified by name and date of birth, is a 56 y.o. male with a diagnosis of Diabetes: Type 2.   ASSESSMENT  Blood pressure (!) 140/96, height 6' (1.829 m), weight 238 lb 3.2 oz (108 kg). Body mass index is 32.31 kg/m.      Diabetes Self-Management Education - 12/24/16 1041      Visit Information   Visit Type First/Initial     Initial Visit   Diabetes Type Type 2   Are you currently following a meal plan? Yes   What type of meal plan do you follow? "oatmeal, decrease salt, healthier"    Are you taking your medications as prescribed? Yes   Date Diagnosed 4 months ago     Health Coping   How would you rate your overall health? Fair     Psychosocial Assessment   Patient Belief/Attitude about Diabetes Other (comment)  "irritated"   Self-care barriers None   Self-management support Doctor's office;Family   Other persons present Spouse/SO   Patient Concerns Nutrition/Meal planning;Glycemic Control;Medication;Monitoring;Healthy Lifestyle   Special Needs None   Preferred Learning Style Visual   Learning Readiness Ready   How often do you need to have someone help you when you read instructions, pamphlets, or other written materials from your doctor or pharmacy? 1 - Never   What is the last grade level you completed in school? 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 Needs Instruction   Patient understands incorporating nutritional management into lifestyle. Needs Instruction   Patient undertands incorporating physical activity into lifestyle. Needs Instruction   Patient understands using medications safely. Needs Instruction   Patient understands monitoring blood glucose, interpreting and using results Needs Review   Patient understands prevention, detection,  and treatment of acute complications. Needs Instruction   Patient understands prevention, detection, and treatment of chronic complications. Needs Instruction   Patient understands how to develop strategies to address psychosocial issues. Needs Instruction   Patient understands how to develop strategies to promote health/change behavior. Needs Instruction     Complications   Last HgB A1C per patient/outside source 9.4 %  10/16/16   How often do you check your blood sugar? 1-2 times/day   Fasting Blood glucose range (mg/dL) 70-129;130-179  FBG's 109-165 mg/dL   Postprandial Blood glucose range (mg/dL) --  noon readings (some are pre and some are post meal) 104-162 mg/dL   Have you had a dilated eye exam in the past 12 months? Yes   Have you had a dental exam in the past 12 months? Yes   Are you checking your feet? Yes   How many days per week are you checking your feet? 1     Dietary Intake   Breakfast oatmeal with fruit; biscuit with bacon, egg and cheese   Lunch soup, sandwich   Snack (afternoon) fruit or yogurt   Dinner crock pot meals; roast, chicken, pork, fish with potatoes, carrots, lima beans   Snack (evening) fruit or yogurt   Beverage(s) water     Exercise   Exercise Type Light (walking / raking leaves)  walks with he golfs   How many days per week to you exercise? 3   How many minutes per day do you exercise? 45   Total minutes per week of exercise 135  Patient Education   Previous Diabetes Education No   Disease state  Definition of diabetes, type 1 and 2, and the diagnosis of diabetes   Nutrition management  Role of diet in the treatment of diabetes and the relationship between the three main macronutrients and blood glucose level;Food label reading, portion sizes and measuring food.;Reviewed blood glucose goals for pre and post meals and how to evaluate the patients' food intake on their blood glucose level.   Physical activity and exercise  Role of exercise on  diabetes management, blood pressure control and cardiac health.   Medications Taught/reviewed insulin injection, site rotation, insulin storage and needle disposal.;Reviewed patients medication for diabetes, action, purpose, timing of dose and side effects.   Monitoring Purpose and frequency of SMBG.;Taught/discussed recording of test results and interpretation of SMBG.;Identified appropriate SMBG and/or A1C goals.   Acute complications Taught treatment of hypoglycemia - the 15 rule.   Chronic complications Relationship between chronic complications and blood glucose control   Psychosocial adjustment Identified and addressed patients feelings and concerns about diabetes     Individualized Goals (developed by patient)   Reducing Risk Improve blood sugars Decrease medications Prevent diabetes complications Lead a healthier lifestyle Become more fit     Outcomes   Expected Outcomes Demonstrated interest in learning. Expect positive outcomes   Future DMSE 4-6 wks      Individualized Plan for Diabetes Self-Management Training:   Learning Objective:  Patient will have a greater understanding of diabetes self-management. Patient education plan is to attend individual and/or group sessions per assessed needs and concerns.   Plan:   Patient Instructions  Check blood sugars 2 x day before breakfast and 2 hrs after supper every day Exercise: Continue walking for   45  minutes   2-3  days a week and gradually increase to 150 minutes/week Eat 3 meals day, 1-2  snacks a day Space meals 4-6 hours apart Limit fried foods Bring blood sugar records to the next class Carry fast acting glucose and a snack at all times Rotate injection sites   Expected Outcomes:  Demonstrated interest in learning. Expect positive outcomes  Education material provided:  General Meal Planning Guidelines Simple Meal Plan Glucose tablets Symptoms, causes and treatments of Hypoglycemia  If problems or questions,  patient to contact team via:  Johny Drilling, [place], [place], CDE [phone]  Future DSME appointment: 4-6 wks  January 24, 2017 for Diabetes Class 1

## 2016-12-27 ENCOUNTER — Ambulatory Visit: Payer: 59

## 2017-01-01 ENCOUNTER — Other Ambulatory Visit: Payer: Self-pay | Admitting: Family Medicine

## 2017-01-02 DIAGNOSIS — Z794 Long term (current) use of insulin: Secondary | ICD-10-CM | POA: Diagnosis not present

## 2017-01-02 DIAGNOSIS — E1165 Type 2 diabetes mellitus with hyperglycemia: Secondary | ICD-10-CM | POA: Diagnosis not present

## 2017-01-08 DIAGNOSIS — E1165 Type 2 diabetes mellitus with hyperglycemia: Secondary | ICD-10-CM | POA: Diagnosis not present

## 2017-01-08 DIAGNOSIS — Z794 Long term (current) use of insulin: Secondary | ICD-10-CM | POA: Diagnosis not present

## 2017-01-08 DIAGNOSIS — I10 Essential (primary) hypertension: Secondary | ICD-10-CM | POA: Diagnosis not present

## 2017-01-24 ENCOUNTER — Ambulatory Visit: Payer: 59

## 2017-02-12 ENCOUNTER — Other Ambulatory Visit: Payer: Self-pay | Admitting: Family Medicine

## 2017-02-14 ENCOUNTER — Encounter: Payer: Self-pay | Admitting: *Deleted

## 2017-02-21 DIAGNOSIS — I10 Essential (primary) hypertension: Secondary | ICD-10-CM | POA: Diagnosis not present

## 2017-02-21 DIAGNOSIS — E1165 Type 2 diabetes mellitus with hyperglycemia: Secondary | ICD-10-CM | POA: Diagnosis not present

## 2017-02-21 DIAGNOSIS — Z794 Long term (current) use of insulin: Secondary | ICD-10-CM | POA: Diagnosis not present

## 2017-02-22 IMAGING — CR DG CHEST 2V
2 series · 3 of 3 positions shown · non-contrast
Comparison: 09/17/2016

CLINICAL DATA: Fall 1 week ago, multiple rib fractures

EXAM:
CHEST  2 VIEW

[chest pa]
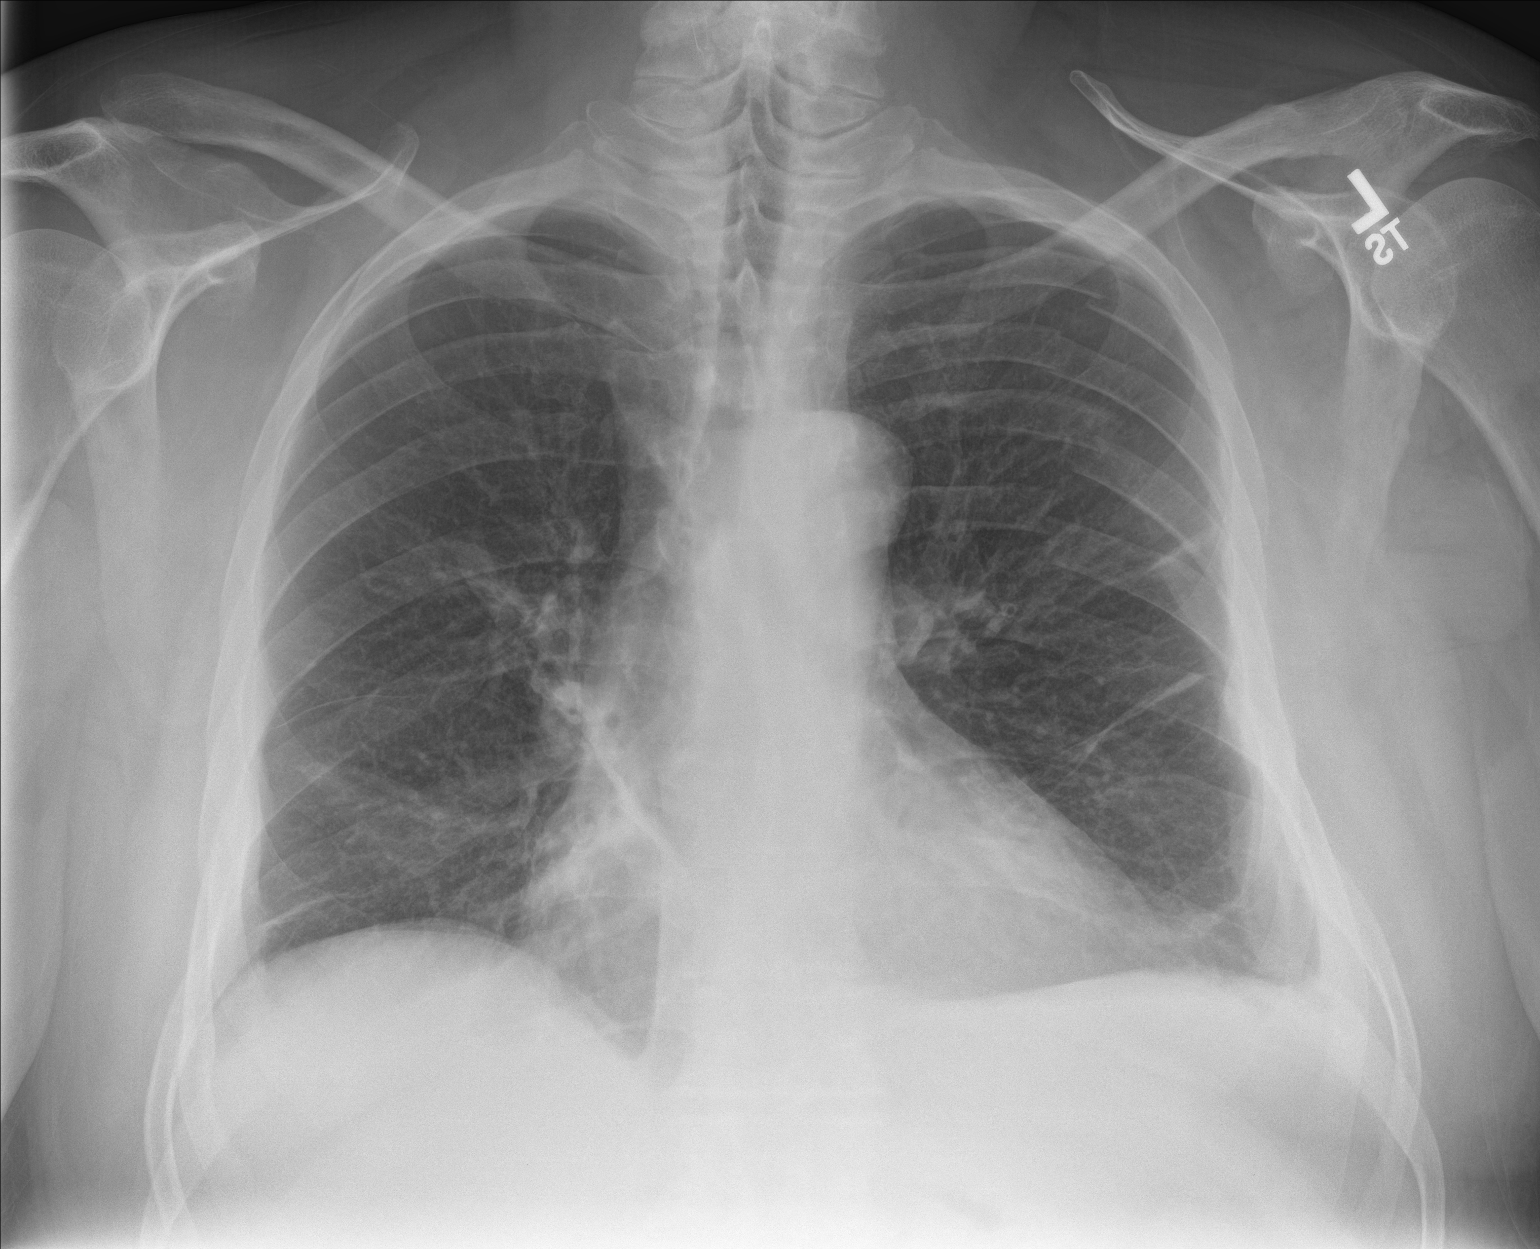

[Series 2: chest lat · 0.14mm/px · 2 of 2 slices shown]
[im 1/2]
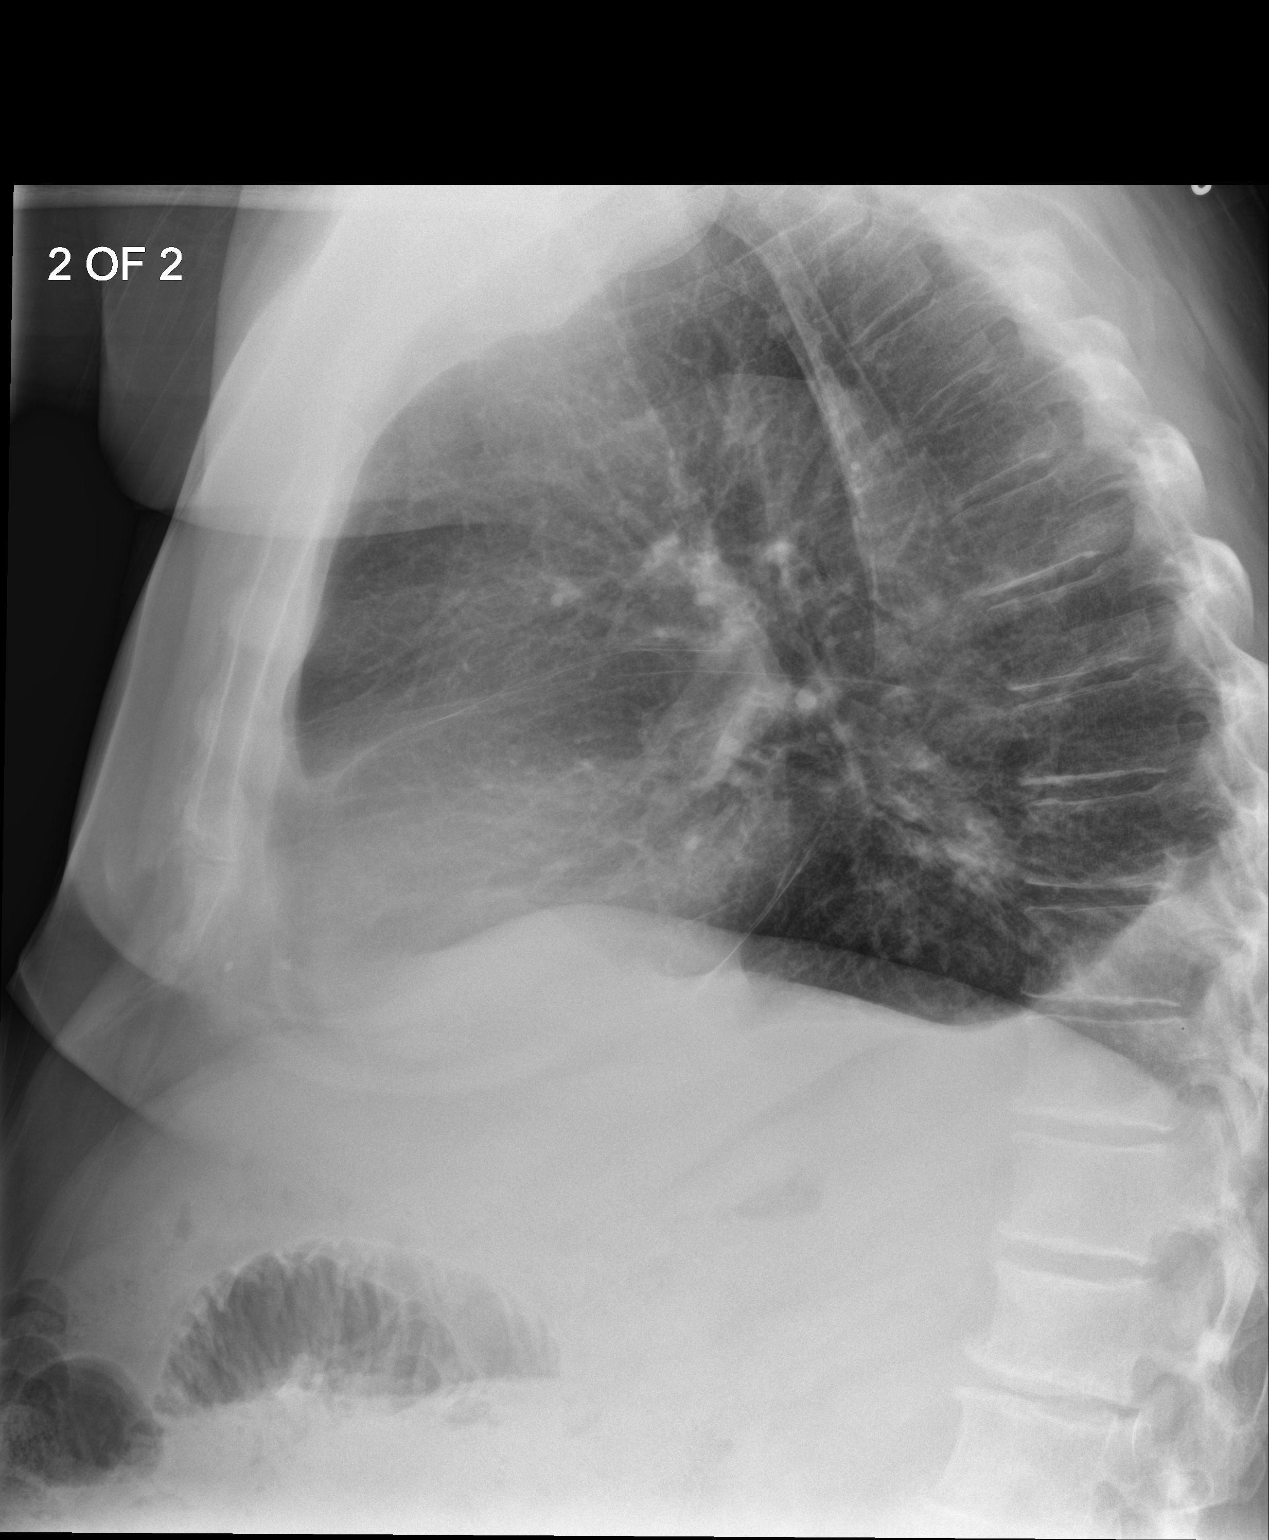
[im 2/2]
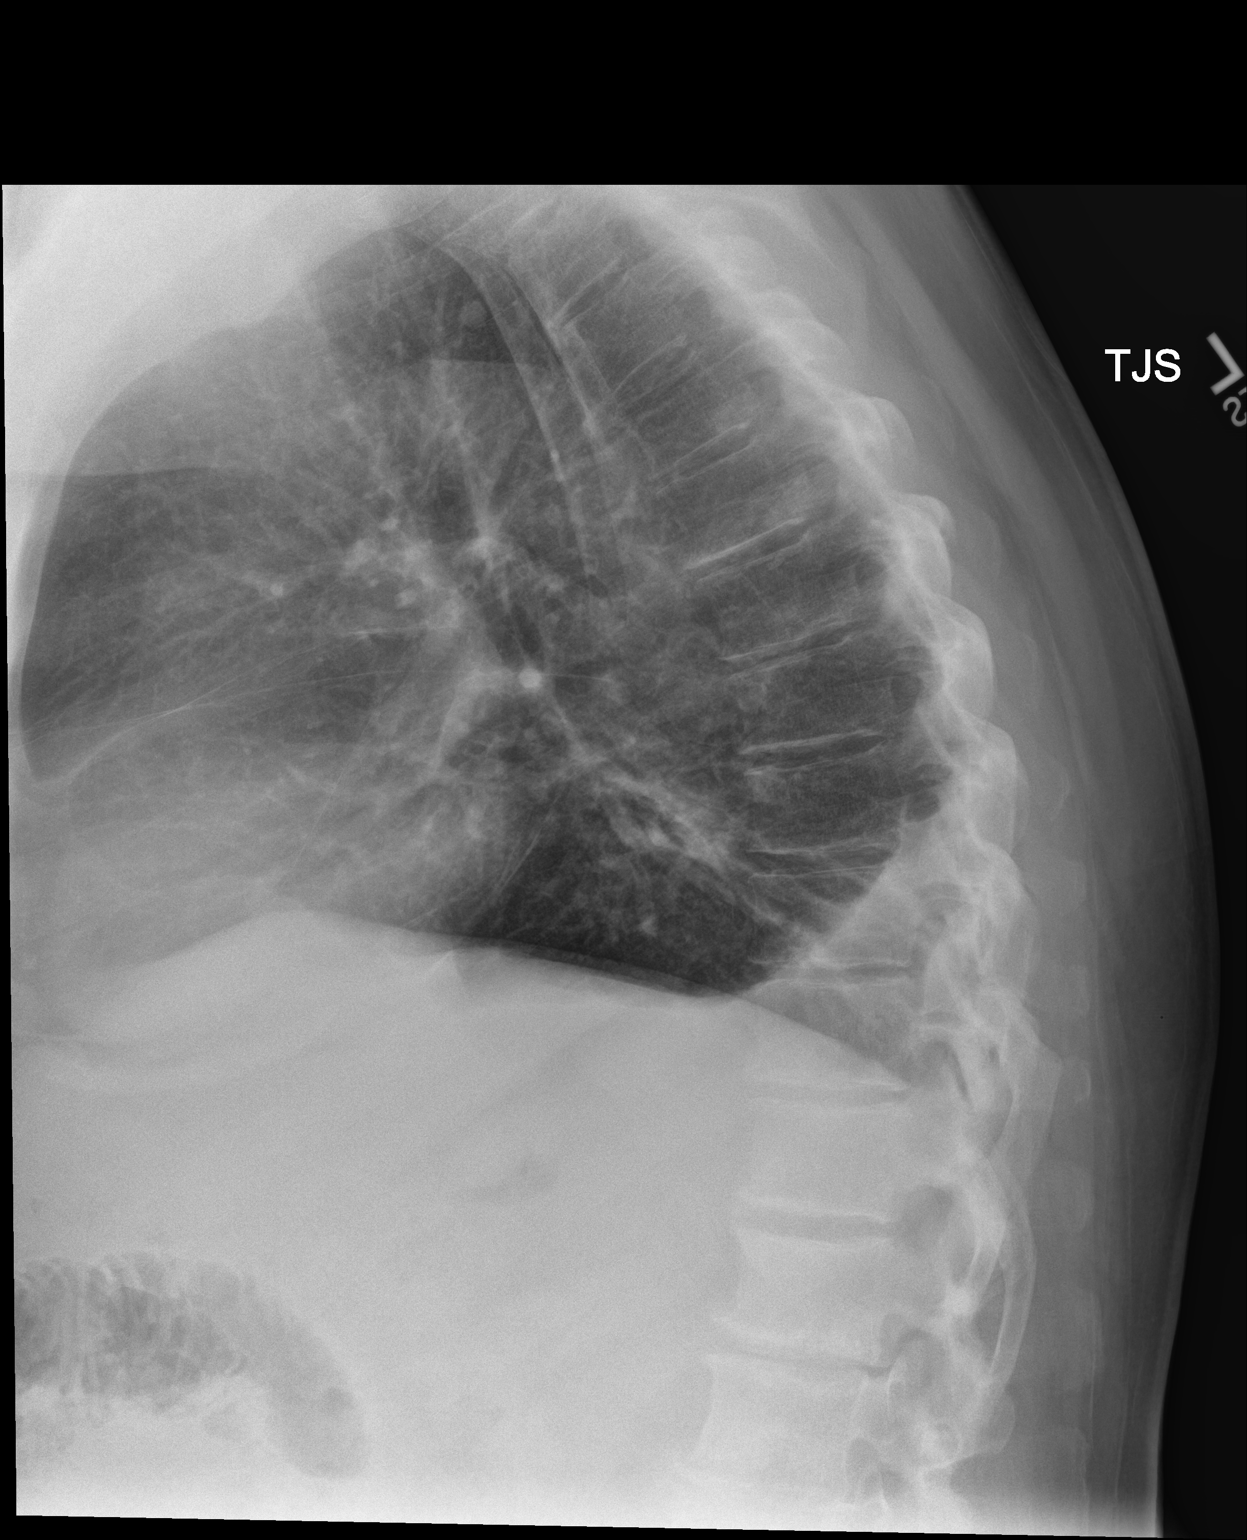

[3 of 3 positions shown; findings below may reference images not displayed]

FINDINGS: Cardiomediastinal silhouette is stable. Multiple displaced left rib
fractures again noted. No pneumothorax. There is small left pleural
effusion. Left base air infiltrate, atelectasis or lung contusion.
IMPRESSION: Multiple displaced left rib fractures again noted. No pneumothorax.
There is small left pleural effusion. Left base air infiltrate,
atelectasis or lung contusion.

## 2017-03-18 ENCOUNTER — Other Ambulatory Visit: Payer: Self-pay | Admitting: Family Medicine

## 2017-03-26 DIAGNOSIS — Z6833 Body mass index (BMI) 33.0-33.9, adult: Secondary | ICD-10-CM

## 2017-03-26 DIAGNOSIS — E119 Type 2 diabetes mellitus without complications: Secondary | ICD-10-CM | POA: Insufficient documentation

## 2017-03-26 DIAGNOSIS — E6609 Other obesity due to excess calories: Secondary | ICD-10-CM | POA: Insufficient documentation

## 2017-03-26 DIAGNOSIS — I10 Essential (primary) hypertension: Secondary | ICD-10-CM | POA: Diagnosis not present

## 2017-03-26 DIAGNOSIS — R011 Cardiac murmur, unspecified: Secondary | ICD-10-CM | POA: Insufficient documentation

## 2017-03-28 DIAGNOSIS — Z794 Long term (current) use of insulin: Secondary | ICD-10-CM | POA: Diagnosis not present

## 2017-03-28 DIAGNOSIS — E119 Type 2 diabetes mellitus without complications: Secondary | ICD-10-CM | POA: Diagnosis not present

## 2017-03-28 DIAGNOSIS — E785 Hyperlipidemia, unspecified: Secondary | ICD-10-CM

## 2017-03-28 DIAGNOSIS — I10 Essential (primary) hypertension: Secondary | ICD-10-CM | POA: Diagnosis not present

## 2017-03-28 DIAGNOSIS — E1169 Type 2 diabetes mellitus with other specified complication: Secondary | ICD-10-CM | POA: Insufficient documentation

## 2017-05-06 DIAGNOSIS — H34212 Partial retinal artery occlusion, left eye: Secondary | ICD-10-CM | POA: Diagnosis not present

## 2017-05-06 DIAGNOSIS — E113293 Type 2 diabetes mellitus with mild nonproliferative diabetic retinopathy without macular edema, bilateral: Secondary | ICD-10-CM | POA: Diagnosis not present

## 2017-05-06 DIAGNOSIS — H35033 Hypertensive retinopathy, bilateral: Secondary | ICD-10-CM | POA: Diagnosis not present

## 2017-05-10 DIAGNOSIS — M5136 Other intervertebral disc degeneration, lumbar region: Secondary | ICD-10-CM | POA: Diagnosis not present

## 2017-05-10 DIAGNOSIS — M25551 Pain in right hip: Secondary | ICD-10-CM | POA: Diagnosis not present

## 2017-06-20 DIAGNOSIS — Z794 Long term (current) use of insulin: Secondary | ICD-10-CM | POA: Diagnosis not present

## 2017-06-20 DIAGNOSIS — E1165 Type 2 diabetes mellitus with hyperglycemia: Secondary | ICD-10-CM | POA: Diagnosis not present

## 2017-06-24 DIAGNOSIS — E119 Type 2 diabetes mellitus without complications: Secondary | ICD-10-CM | POA: Diagnosis not present

## 2017-06-24 DIAGNOSIS — E1169 Type 2 diabetes mellitus with other specified complication: Secondary | ICD-10-CM | POA: Diagnosis not present

## 2017-06-24 DIAGNOSIS — E1159 Type 2 diabetes mellitus with other circulatory complications: Secondary | ICD-10-CM | POA: Diagnosis not present

## 2017-07-23 DIAGNOSIS — E7800 Pure hypercholesterolemia, unspecified: Secondary | ICD-10-CM | POA: Diagnosis not present

## 2017-07-30 DIAGNOSIS — N528 Other male erectile dysfunction: Secondary | ICD-10-CM | POA: Insufficient documentation

## 2017-07-30 DIAGNOSIS — E1159 Type 2 diabetes mellitus with other circulatory complications: Secondary | ICD-10-CM | POA: Diagnosis not present

## 2017-07-30 DIAGNOSIS — E1169 Type 2 diabetes mellitus with other specified complication: Secondary | ICD-10-CM | POA: Diagnosis not present

## 2017-08-20 DIAGNOSIS — M25551 Pain in right hip: Secondary | ICD-10-CM | POA: Diagnosis not present

## 2017-10-04 DIAGNOSIS — M67351 Transient synovitis, right hip: Secondary | ICD-10-CM | POA: Diagnosis not present

## 2017-10-22 DIAGNOSIS — L570 Actinic keratosis: Secondary | ICD-10-CM | POA: Diagnosis not present

## 2017-10-22 DIAGNOSIS — L578 Other skin changes due to chronic exposure to nonionizing radiation: Secondary | ICD-10-CM | POA: Diagnosis not present

## 2017-10-22 DIAGNOSIS — D492 Neoplasm of unspecified behavior of bone, soft tissue, and skin: Secondary | ICD-10-CM | POA: Diagnosis not present

## 2017-10-28 DIAGNOSIS — B351 Tinea unguium: Secondary | ICD-10-CM | POA: Diagnosis not present

## 2017-11-07 DIAGNOSIS — E119 Type 2 diabetes mellitus without complications: Secondary | ICD-10-CM | POA: Diagnosis not present

## 2017-11-07 DIAGNOSIS — L600 Ingrowing nail: Secondary | ICD-10-CM | POA: Diagnosis not present

## 2017-11-07 DIAGNOSIS — B351 Tinea unguium: Secondary | ICD-10-CM | POA: Diagnosis not present

## 2017-11-25 DIAGNOSIS — E119 Type 2 diabetes mellitus without complications: Secondary | ICD-10-CM | POA: Diagnosis not present

## 2017-11-25 DIAGNOSIS — H35033 Hypertensive retinopathy, bilateral: Secondary | ICD-10-CM | POA: Diagnosis not present

## 2017-12-05 DIAGNOSIS — E1169 Type 2 diabetes mellitus with other specified complication: Secondary | ICD-10-CM | POA: Diagnosis not present

## 2017-12-05 DIAGNOSIS — I10 Essential (primary) hypertension: Secondary | ICD-10-CM | POA: Diagnosis not present

## 2017-12-05 DIAGNOSIS — E785 Hyperlipidemia, unspecified: Secondary | ICD-10-CM | POA: Diagnosis not present

## 2017-12-05 DIAGNOSIS — E1159 Type 2 diabetes mellitus with other circulatory complications: Secondary | ICD-10-CM | POA: Diagnosis not present

## 2017-12-05 DIAGNOSIS — Z0000 Encounter for general adult medical examination without abnormal findings: Secondary | ICD-10-CM | POA: Diagnosis not present

## 2017-12-12 DIAGNOSIS — Z0000 Encounter for general adult medical examination without abnormal findings: Secondary | ICD-10-CM | POA: Diagnosis not present

## 2017-12-12 DIAGNOSIS — Z7185 Encounter for immunization safety counseling: Secondary | ICD-10-CM | POA: Insufficient documentation

## 2017-12-12 DIAGNOSIS — E1169 Type 2 diabetes mellitus with other specified complication: Secondary | ICD-10-CM | POA: Diagnosis not present

## 2017-12-12 DIAGNOSIS — Z7189 Other specified counseling: Secondary | ICD-10-CM | POA: Insufficient documentation

## 2017-12-12 DIAGNOSIS — E1159 Type 2 diabetes mellitus with other circulatory complications: Secondary | ICD-10-CM | POA: Diagnosis not present

## 2018-01-29 DIAGNOSIS — E119 Type 2 diabetes mellitus without complications: Secondary | ICD-10-CM | POA: Diagnosis not present

## 2018-02-04 DIAGNOSIS — S83411A Sprain of medial collateral ligament of right knee, initial encounter: Secondary | ICD-10-CM | POA: Diagnosis not present

## 2018-02-04 DIAGNOSIS — M25562 Pain in left knee: Secondary | ICD-10-CM | POA: Diagnosis not present

## 2018-02-04 DIAGNOSIS — M7122 Synovial cyst of popliteal space [Baker], left knee: Secondary | ICD-10-CM | POA: Diagnosis not present

## 2018-02-05 DIAGNOSIS — E1169 Type 2 diabetes mellitus with other specified complication: Secondary | ICD-10-CM | POA: Diagnosis not present

## 2018-02-05 DIAGNOSIS — E1159 Type 2 diabetes mellitus with other circulatory complications: Secondary | ICD-10-CM | POA: Diagnosis not present

## 2018-02-05 DIAGNOSIS — E119 Type 2 diabetes mellitus without complications: Secondary | ICD-10-CM | POA: Diagnosis not present

## 2018-03-04 DIAGNOSIS — J209 Acute bronchitis, unspecified: Secondary | ICD-10-CM | POA: Diagnosis not present

## 2018-03-04 DIAGNOSIS — E1159 Type 2 diabetes mellitus with other circulatory complications: Secondary | ICD-10-CM | POA: Diagnosis not present

## 2018-04-14 ENCOUNTER — Other Ambulatory Visit: Payer: Self-pay | Admitting: Orthopedic Surgery

## 2018-04-14 DIAGNOSIS — M1712 Unilateral primary osteoarthritis, left knee: Secondary | ICD-10-CM | POA: Diagnosis not present

## 2018-04-22 ENCOUNTER — Ambulatory Visit
Admission: RE | Admit: 2018-04-22 | Discharge: 2018-04-22 | Disposition: A | Discharge status: Home / Self Care | Payer: 59 | Source: Ambulatory Visit | Attending: Orthopedic Surgery | Admitting: Orthopedic Surgery

## 2018-04-22 DIAGNOSIS — M1712 Unilateral primary osteoarthritis, left knee: Secondary | ICD-10-CM | POA: Diagnosis present

## 2018-04-22 DIAGNOSIS — M879 Osteonecrosis, unspecified: Secondary | ICD-10-CM | POA: Diagnosis not present

## 2018-04-24 DIAGNOSIS — L570 Actinic keratosis: Secondary | ICD-10-CM | POA: Diagnosis not present

## 2018-04-28 DIAGNOSIS — M1712 Unilateral primary osteoarthritis, left knee: Secondary | ICD-10-CM | POA: Diagnosis not present

## 2018-04-28 DIAGNOSIS — M87052 Idiopathic aseptic necrosis of left femur: Secondary | ICD-10-CM | POA: Diagnosis not present

## 2018-04-29 ENCOUNTER — Ambulatory Visit: Payer: 59 | Admitting: Urology

## 2018-04-29 ENCOUNTER — Encounter: Payer: Self-pay | Admitting: Urology

## 2018-04-29 VITALS — BP 100/73 | HR 88 | Ht 72.0 in | Wt 223.8 lb

## 2018-04-29 DIAGNOSIS — N5201 Erectile dysfunction due to arterial insufficiency: Secondary | ICD-10-CM

## 2018-04-30 ENCOUNTER — Telehealth: Payer: Self-pay

## 2018-04-30 MED ORDER — SILDENAFIL CITRATE 20 MG PO TABS: ORAL_TABLET | ORAL | 0 refills | Status: DC

## 2018-04-30 NOTE — Telephone Encounter (Signed)
[facility]  If he is being charged more than a $1.50 per tablet would let us know and [facility] will [facility]

## 2018-04-30 NOTE — Progress Notes (Signed)
04/29/2018 10:58 AM   [place] Nov 17, 1960 [id number]  Referring provider: Dion Body, MD [facility] [place], [place] [address]  Chief Complaint  Patient presents with  . Erectile Dysfunction    HPI: 57 year old male seen in consultation at the request of Dr. Netty Starring for evaluation of erectile dysfunction.  He presents with a 2-year history of difficulty achieving and maintaining an erection.  He has partial erections which are not firm enough for penetration.  He relates onset of his symptoms after starting medications for blood pressure and cholesterol.  He denies previous tobacco history.  Organic risk factors include hypercholesterolemia, hypertension, diabetes and antihypertensive medications including amlodipine, hydralazine, HCTZ and losartan.  He is on insulin, metformin and Januvia.  He denies pain or curvature with erections.  He has good libido and energy.  He was given a trial of generic sildenafil which he states was not effective.  He presents today to discuss other treatment options.   PMH: Past Medical History:  Diagnosis Date  . GERD (gastroesophageal reflux disease)   . Hypertension   . Rib fracture 09/12/2016   after being jerked against a door frame by his son's dog.  Pt sustained Lt rib fractures 5-10 with flail chest segment 5-7/notes 09/13/2016  . Type II diabetes mellitus ([place])    dx'd ~ 08/2016    Surgical History: Past Surgical History:  Procedure Laterality Date  . COLONOSCOPY WITH PROPOFOL N/A 02/09/2016   Procedure: COLONOSCOPY WITH PROPOFOL;  Surgeon: Hulen Luster, MD;  Location: [facility] ENDOSCOPY;  Service: Gastroenterology;  Laterality: N/A;  . ESOPHAGOGASTRODUODENOSCOPY (EGD) WITH PROPOFOL N/A 02/09/2016   Procedure: ESOPHAGOGASTRODUODENOSCOPY (EGD) WITH PROPOFOL;  Surgeon: Hulen Luster, MD;  Location: [facility] ENDOSCOPY;  Service: Gastroenterology;  Laterality: N/A;  . JOINT REPLACEMENT  2000s   "joints  in big toes"   . KNEE ARTHROSCOPY Left ~ 2010  . TONSILLECTOMY      Home Medications:  Allergies as of 04/29/2018      Reactions   Penicillins Hives, Other (See Comments)   Has patient had a PCN reaction causing immediate rash, facial/tongue/throat swelling, SOB or lightheadedness with hypotension: yes Has patient had a PCN reaction causing severe rash involving mucus membranes or skin necrosis: no Has patient had a PCN reaction that required hospitalization no Has patient had a PCN reaction occurring within the last 10 years: no If all of the above answers are "NO", then may proceed with Cephalosporin use.      Medication List        Accurate as of 04/29/18 11:59 PM. Always use your most recent med list.          amLODipine 10 MG tablet Commonly known as:  NORVASC   aspirin EC 81 MG tablet Take by mouth.   hydrALAZINE 25 MG tablet Commonly known as:  APRESOLINE Take 25 mg by mouth 3 (three) times daily.   hydrochlorothiazide 12.5 MG tablet Commonly known as:  HYDRODIURIL Take 12.5 mg by mouth daily.   insulin glargine 100 UNIT/ML injection Commonly known as:  LANTUS Inject 0.32 mLs (32 Units total) into the skin 2 (two) times daily. Wife is giving BID- continue and we will see at next visit   losartan 100 MG tablet Commonly known as:  COZAAR Take 1 tablet (100 mg total) by mouth daily.   meloxicam 15 MG tablet Commonly known as:  MOBIC Take 15 mg by mouth daily.   metFORMIN 500 MG tablet Commonly known  as:  GLUCOPHAGE Take 1 tablet (500 mg total) by mouth 2 (two) times daily with a meal.   naproxen 500 MG tablet Commonly known as:  NAPROSYN Take 1 tablet (500 mg total) by mouth 2 (two) times daily with a meal.   pantoprazole 40 MG tablet Commonly known as:  PROTONIX Take 40 mg by mouth daily.   PROAIR HFA 108 (90 Base) MCG/ACT inhaler Generic drug:  albuterol Inhale into the lungs.   rosuvastatin 20 MG tablet Commonly known as:  CRESTOR     sitaGLIPtin 100 MG tablet Commonly known as:  JANUVIA Take 1 tablet (100 mg total) by mouth daily. Gave samples- hold Rx for now   SYNJARDY XR 12.01-999 MG Tb24 Generic drug:  Empagliflozin-metFORMIN HCl ER Take by mouth.   temazepam 15 MG capsule Commonly known as:  RESTORIL TAKE 1 CAPSULE (15 MG TOTAL) BY MOUTH NIGHTLY AS NEEDED FOR SLEEP   traMADol 50 MG tablet Commonly known as:  ULTRAM       Allergies:  Allergies  Allergen Reactions  . Penicillins Hives and Other (See Comments)    Has patient had a PCN reaction causing immediate rash, facial/tongue/throat swelling, SOB or lightheadedness with hypotension: yes Has patient had a PCN reaction causing severe rash involving mucus membranes or skin necrosis: no Has patient had a PCN reaction that required hospitalization no Has patient had a PCN reaction occurring within the last 10 years: no If all of the above answers are "NO", then may proceed with Cephalosporin use.      Family History: Family History  Problem Relation Age of Onset  . Hypertension Mother   . Heart disease Father   . Hypertension Father     Social History:  reports that he has never smoked. He has never used smokeless tobacco. He reports that he drinks about 8.4 oz of alcohol per week. He reports that he does not use drugs.  ROS: UROLOGY Frequent Urination?: No Hard to postpone urination?: No Burning/pain with urination?: No Get up at night to urinate?: No Leakage of urine?: No Urine stream starts and stops?: No Trouble starting stream?: No Do you have to strain to urinate?: No Blood in urine?: No Urinary tract infection?: No Sexually transmitted disease?: No Injury to kidneys or bladder?: No Painful intercourse?: No Weak stream?: No Erection problems?: Yes Penile pain?: No  Gastrointestinal Nausea?: No Vomiting?: No Indigestion/heartburn?: No Diarrhea?: No Constipation?: No  Constitutional Fever: No Night sweats?: No Weight  loss?: No Fatigue?: No  Skin Skin rash/lesions?: No Itching?: No  Eyes Blurred vision?: No Double vision?: No  Ears/Nose/Throat Sore throat?: No Sinus problems?: No  Hematologic/Lymphatic Swollen glands?: No Easy bruising?: No  Cardiovascular Leg swelling?: No Chest pain?: No  Respiratory Cough?: No Shortness of breath?: No  Endocrine Excessive thirst?: No  Musculoskeletal Back pain?: No Joint pain?: No  Neurological Headaches?: No Dizziness?: No  Psychologic Depression?: No Anxiety?: No  Physical Exam: BP 100/73 (BP Location: Left Arm, Patient Position: Sitting, Cuff Size: Large)   Pulse 88   Ht 6' (1.829 m)   Wt 223 lb 12.8 oz (101.5 kg)   BMI 30.35 kg/m   Constitutional:  Alert and oriented, No acute distress. HEENT: [employer] AT, moist mucus membranes.  Trachea midline, no masses. Cardiovascular: No clubbing, cyanosis, or edema. Respiratory: Normal respiratory effort, no increased work of breathing. GI: Abdomen is soft, nontender, nondistended, no abdominal masses GU: No CVA tenderness.  Penis normal without corporal plaques.  Testes descended bilaterally without  masses or tenderness. Lymph: No cervical or inguinal lymphadenopathy. Skin: No rashes, bruises or suspicious lesions. Neurologic: Grossly intact, no focal deficits, moving all 4 extremities. Psychiatric: Normal mood and affect.    Assessment & Plan:   57 year old male with erectile dysfunction and significant organic risk factors.  I reviewed his chart and it looks like he only titrated sildenafil to 60 mg and took Cialis 10 mg.  He was contacted to see if he desired to titrate these medications to the maximum dose.  I also discussed intracavernosal injection therapy which he does not desire.  Vacuum erection devices were discussed which he was interested and was given literature.  He was not interested in pursuing penile implant surgery at this time.   Abbie Sons, [place] [address] [place], [place] [address] [phone]

## 2018-04-30 NOTE — Telephone Encounter (Signed)
Pt informed

## 2018-04-30 NOTE — Telephone Encounter (Signed)
-----   Message from Abbie Sons, MD sent at 04/30/2018 11:05 AM EDT ----- Please let patient know I reviewed his chart after he left yesterday.  It looks like the sildenafil was only titrated to 60 mg.  If he desires I can send in an additional prescription that he can titrate to 100 mg (5 tablets)

## 2018-04-30 NOTE — Telephone Encounter (Signed)
Pt informed, he would like to proceed with 100.

## 2018-05-01 ENCOUNTER — Encounter: Payer: Self-pay | Admitting: Urology

## 2018-05-14 ENCOUNTER — Other Ambulatory Visit: Payer: Self-pay

## 2018-05-14 ENCOUNTER — Encounter
Admission: RE | Admit: 2018-05-14 | Discharge: 2018-05-14 | Disposition: A | Discharge status: Home / Self Care | Payer: 59 | Source: Ambulatory Visit | Attending: Orthopedic Surgery | Admitting: Orthopedic Surgery

## 2018-05-14 DIAGNOSIS — Z01812 Encounter for preprocedural laboratory examination: Secondary | ICD-10-CM | POA: Diagnosis present

## 2018-05-14 DIAGNOSIS — Z01810 Encounter for preprocedural cardiovascular examination: Secondary | ICD-10-CM | POA: Diagnosis not present

## 2018-05-14 DIAGNOSIS — E7841 Elevated Lipoprotein(a): Secondary | ICD-10-CM | POA: Diagnosis not present

## 2018-05-14 DIAGNOSIS — E785 Hyperlipidemia, unspecified: Secondary | ICD-10-CM | POA: Diagnosis not present

## 2018-05-14 HISTORY — DX: Unspecified osteoarthritis, unspecified site: M19.90

## 2018-05-14 LAB — BASIC METABOLIC PANEL
Anion gap: 9 (ref 5–15)
BUN: 20 mg/dL (ref 6–20)
CALCIUM: 9.3 mg/dL (ref 8.9–10.3)
CHLORIDE: 105 mmol/L (ref 98–111)
CO2: 23 mmol/L (ref 22–32)
CREATININE: 1.02 mg/dL (ref 0.61–1.24)
Glucose, Bld: 159 mg/dL — ABNORMAL HIGH (ref 70–99)
Potassium: 3.7 mmol/L (ref 3.5–5.1)
SODIUM: 137 mmol/L (ref 135–145)

## 2018-05-14 LAB — TYPE AND SCREEN
ABO/RH(D): O POS
Antibody Screen: NEGATIVE

## 2018-05-14 LAB — CBC
HCT: 36.6 % — ABNORMAL LOW (ref 40.0–52.0)
HEMOGLOBIN: 12.7 g/dL — AB (ref 13.0–18.0)
MCH: 32.9 pg (ref 26.0–34.0)
MCHC: 34.7 g/dL (ref 32.0–36.0)
MCV: 94.8 fL (ref 80.0–100.0)
PLATELETS: 200 10*3/uL (ref 150–440)
RBC: 3.86 MIL/uL — AB (ref 4.40–5.90)
RDW: 13.9 % (ref 11.5–14.5)
WBC: 4.9 10*3/uL (ref 3.8–10.6)

## 2018-05-14 LAB — SURGICAL PCR SCREEN
MRSA, PCR: NEGATIVE
Staphylococcus aureus: NEGATIVE

## 2018-05-14 LAB — PROTIME-INR
INR: 0.94
PROTHROMBIN TIME: 12.5 s (ref 11.4–15.2)

## 2018-05-14 LAB — APTT: aPTT: 31 seconds (ref 24–36)

## 2018-05-14 NOTE — Patient Instructions (Signed)
Your procedure is scheduled on: 05/27/18 Tues Report to Same Day Surgery 2nd floor medical mall [facility] Entrance-take elevator on left to 2nd floor.  Check in with surgery information desk.) To find out your arrival time please call [phone] between 1PM - 3PM on 05/26/18 Mon  Remember: Instructions that are not followed completely may result in serious medical risk, up to and including death, or upon the discretion of your surgeon and anesthesiologist your surgery may need to be rescheduled.    _x___ 1. Do not eat food after midnight the night before your procedure. You may drink clear liquids up to 2 hours before you are scheduled to arrive at the hospital for your procedure.  Do not drink clear liquids within 2 hours of your scheduled arrival to the hospital.  Clear liquids include  --Water or Apple juice without pulp  --Clear carbohydrate beverage such as ClearFast or Gatorade  --Black Coffee or Clear Tea (No milk, no creamers, do not add anything to                  the coffee or Tea Type 1 and type 2 diabetics should only drink water.   ____Ensure clear carbohydrate drink on the way to the hospital for bariatric patients  ____Ensure clear carbohydrate drink 3 hours before surgery for Dr Dwyane Luo patients if physician instructed.   No gum chewing or hard candies.     __x__ 2. No Alcohol for 24 hours before or after surgery.   __x__3. No Smoking or e-cigarettes for 24 prior to surgery.  Do not use any chewable tobacco products for at least 6 hour prior to surgery   ____  4. Bring all medications with you on the day of surgery if instructed.    __x__ 5. Notify your doctor if there is any change in your medical condition     (cold, fever, infections).    x___6. On the morning of surgery brush your teeth with toothpaste and water.  You may rinse your mouth with mouth wash if you wish.  Do not swallow any toothpaste or mouthwash.   Do not wear jewelry, make-up, hairpins,  clips or nail polish.  Do not wear lotions, powders, or perfumes. You may wear deodorant.  Do not shave 48 hours prior to surgery. Men may shave face and neck.  Do not bring valuables to the hospital.    [facility] is not responsible for any belongings or valuables.               Contacts, dentures or bridgework may not be worn into surgery.  Leave your suitcase in the car. After surgery it may be brought to your room.  For patients admitted to the hospital, discharge time is determined by your                       treatment team.  _  Patients discharged the day of surgery will not be allowed to drive home.  You will need someone to drive you home and stay with you the night of your procedure.    Please read over the following fact sheets that you were given:   [facility] Preparing for Surgery and or MRSA Information   _x___ Take anti-hypertensive listed below, cardiac, seizure, asthma,     anti-reflux and psychiatric medicines. These include:  1. amLODipine (NORVASC) 10 MG tablet  2.pantoprazole (PROTONIX) 40 MG tablet  3.traMADol (ULTRAM) 50 MG tablet if needed  4.  5.  6.  ____Fleets enema or Magnesium Citrate as directed.   _x___ Use CHG Soap or sage wipes as directed on instruction sheet   ____ Use inhalers on the day of surgery and bring to hospital day of surgery  _x___ Stop Metformin and Janumet 2 days prior to surgery.    ____ Take 1/2 of usual insulin dose the night before surgery and none on the morning     surgery.   _x___ Follow recommendations from Cardiologist, Pulmonologist or PCP regarding          stopping Aspirin, Coumadin, Plavix ,Eliquis, Effient, or Pradaxa, and Pletal.  X____Stop Anti-inflammatories such as Advil, Aleve, Ibuprofen, Motrin, Naproxen, Naprosyn, Goodies powders or aspirin products. OK to take Tylenol and                          Celebrex.   _x___ Stop supplements until after surgery.  But may continue Vitamin D, Vitamin B,       and  multivitamin.   ____ Bring C-Pap to the hospital.

## 2018-05-15 ENCOUNTER — Encounter
Admission: RE | Admit: 2018-05-15 | Discharge: 2018-05-15 | Disposition: A | Discharge status: Home / Self Care | Payer: 59 | Source: Ambulatory Visit | Attending: Orthopedic Surgery | Admitting: Orthopedic Surgery

## 2018-05-15 DIAGNOSIS — Z01810 Encounter for preprocedural cardiovascular examination: Secondary | ICD-10-CM | POA: Diagnosis not present

## 2018-05-15 LAB — URINALYSIS, COMPLETE (UACMP) WITH MICROSCOPIC
Bacteria, UA: NONE SEEN
Bilirubin Urine: NEGATIVE
HGB URINE DIPSTICK: NEGATIVE
Ketones, ur: NEGATIVE mg/dL
Leukocytes, UA: NEGATIVE
NITRITE: NEGATIVE
PROTEIN: 30 mg/dL — AB
Specific Gravity, Urine: 1.021 (ref 1.005–1.030)
Squamous Epithelial / LPF: NONE SEEN (ref 0–5)
pH: 5 (ref 5.0–8.0)

## 2018-05-15 NOTE — Pre-Procedure Instructions (Signed)
UA GLU>500 FAXED TO DR [facility]

## 2018-05-16 LAB — URINE CULTURE: CULTURE: NO GROWTH

## 2018-05-19 DIAGNOSIS — E119 Type 2 diabetes mellitus without complications: Secondary | ICD-10-CM | POA: Diagnosis not present

## 2018-05-19 DIAGNOSIS — Z7984 Long term (current) use of oral hypoglycemic drugs: Secondary | ICD-10-CM | POA: Diagnosis not present

## 2018-05-19 DIAGNOSIS — H34212 Partial retinal artery occlusion, left eye: Secondary | ICD-10-CM | POA: Diagnosis not present

## 2018-05-26 MED ORDER — CLINDAMYCIN PHOSPHATE 900 MG/50ML IV SOLN: 900.0000 mg | Freq: Once | INTRAVENOUS | Status: DC

## 2018-05-26 MED ORDER — TRANEXAMIC ACID 1000 MG/10ML IV SOLN
1000.0000 mg | INTRAVENOUS | Status: DC
  Filled 2018-05-26: qty 10

## 2018-05-27 ENCOUNTER — Encounter: Payer: Self-pay | Admitting: Certified Registered Nurse Anesthetist

## 2018-05-27 ENCOUNTER — Ambulatory Visit
Admission: RE | Admit: 2018-05-27 | Discharge: 2018-05-27 | Disposition: A | Discharge status: Home / Self Care | Payer: 59 | Source: Ambulatory Visit | Attending: Orthopedic Surgery | Admitting: Orthopedic Surgery

## 2018-05-27 ENCOUNTER — Encounter
Admission: RE | Disposition: A | Discharge status: Home / Self Care | Payer: Self-pay | Source: Ambulatory Visit | Attending: Orthopedic Surgery

## 2018-05-27 DIAGNOSIS — Z0183 Encounter for blood typing: Secondary | ICD-10-CM | POA: Insufficient documentation

## 2018-05-27 DIAGNOSIS — M1712 Unilateral primary osteoarthritis, left knee: Secondary | ICD-10-CM | POA: Diagnosis not present

## 2018-05-27 DIAGNOSIS — Z5309 Procedure and treatment not carried out because of other contraindication: Secondary | ICD-10-CM | POA: Insufficient documentation

## 2018-05-27 DIAGNOSIS — Z01812 Encounter for preprocedural laboratory examination: Secondary | ICD-10-CM | POA: Diagnosis not present

## 2018-05-27 DIAGNOSIS — Z880 Allergy status to penicillin: Secondary | ICD-10-CM | POA: Diagnosis not present

## 2018-05-27 LAB — ABO/RH: ABO/RH(D): O POS

## 2018-05-27 LAB — GLUCOSE, CAPILLARY: Glucose-Capillary: 127 mg/dL — ABNORMAL HIGH (ref 70–99)

## 2018-05-27 LAB — SEDIMENTATION RATE: Sed Rate: 37 mm/hr — ABNORMAL HIGH (ref 0–20)

## 2018-05-27 SURGERY — ARTHROPLASTY, KNEE, TOTAL
Anesthesia: Choice | Laterality: Left

## 2018-05-27 MED ORDER — MIDAZOLAM HCL 2 MG/2ML IJ SOLN
INTRAMUSCULAR | Status: AC
  Filled 2018-05-27: qty 2

## 2018-05-27 MED ORDER — PROPOFOL 500 MG/50ML IV EMUL
INTRAVENOUS | Status: AC
  Filled 2018-05-27: qty 50

## 2018-05-27 MED ORDER — CLINDAMYCIN PHOSPHATE 900 MG/50ML IV SOLN
INTRAVENOUS | Status: AC
  Filled 2018-05-27: qty 50

## 2018-05-27 MED ORDER — LIDOCAINE HCL (PF) 2 % IJ SOLN
INTRAMUSCULAR | Status: AC
  Filled 2018-05-27: qty 10

## 2018-05-27 MED ORDER — SODIUM CHLORIDE 0.9 % IV SOLN: INTRAVENOUS | Status: DC

## 2018-05-27 MED ORDER — FENTANYL CITRATE (PF) 100 MCG/2ML IJ SOLN
INTRAMUSCULAR | Status: AC
  Filled 2018-05-27: qty 2

## 2018-05-27 SURGICAL SUPPLY — 54 items
BANDAGE ACE 6X5 VEL STRL LF (GAUZE/BANDAGES/DRESSINGS) ×2 IMPLANT
BLADE SAW 1 (BLADE) ×2 IMPLANT
CANISTER SUCT 1200ML W/VALVE (MISCELLANEOUS) ×2 IMPLANT
CANISTER SUCT 3000ML PPV (MISCELLANEOUS) ×4 IMPLANT
CHLORAPREP W/TINT 26ML (MISCELLANEOUS) ×4 IMPLANT
COOLER POLAR GLACIER W/PUMP (MISCELLANEOUS) ×2 IMPLANT
CUFF TOURN 24 STER (MISCELLANEOUS) IMPLANT
CUFF TOURN 30 STER DUAL PORT (MISCELLANEOUS) IMPLANT
DRAPE SHEET LG 3/4 BI-LAMINATE (DRAPES) ×4 IMPLANT
ELECT CAUTERY BLADE 6.4 (BLADE) ×2 IMPLANT
ELECT REM PT RETURN 9FT ADLT (ELECTROSURGICAL) ×2
ELECTRODE REM PT RTRN 9FT ADLT (ELECTROSURGICAL) ×1 IMPLANT
GAUZE PETRO XEROFOAM 1X8 (MISCELLANEOUS) ×2 IMPLANT
GAUZE SPONGE 4X4 12PLY STRL (GAUZE/BANDAGES/DRESSINGS) ×2 IMPLANT
GLOVE BIOGEL PI IND STRL 9 (GLOVE) ×1 IMPLANT
GLOVE BIOGEL PI INDICATOR 9 (GLOVE) ×1
GLOVE INDICATOR 8.0 STRL GRN (GLOVE) ×2 IMPLANT
GLOVE SURG ORTHO 8.0 STRL STRW (GLOVE) ×2 IMPLANT
GLOVE SURG SYN 9.0  PF PI (GLOVE) ×1
GLOVE SURG SYN 9.0 PF PI (GLOVE) ×1 IMPLANT
GOWN SRG 2XL LVL 4 RGLN SLV (GOWNS) ×1 IMPLANT
GOWN STRL NON-REIN 2XL LVL4 (GOWNS) ×1
GOWN STRL REUS W/ TWL LRG LVL3 (GOWN DISPOSABLE) ×1 IMPLANT
GOWN STRL REUS W/ TWL XL LVL3 (GOWN DISPOSABLE) ×1 IMPLANT
GOWN STRL REUS W/TWL LRG LVL3 (GOWN DISPOSABLE) ×1
GOWN STRL REUS W/TWL XL LVL3 (GOWN DISPOSABLE) ×1
HOLDER FOLEY CATH W/STRAP (MISCELLANEOUS) ×2 IMPLANT
HOOD PEEL AWAY FLYTE STAYCOOL (MISCELLANEOUS) ×4 IMPLANT
KIT TURNOVER KIT A (KITS) ×2 IMPLANT
KNIFE SCULPS 14X20 (INSTRUMENTS) ×2 IMPLANT
NDL SAFETY ECLIPSE 18X1.5 (NEEDLE) ×1 IMPLANT
NEEDLE HYPO 18GX1.5 SHARP (NEEDLE) ×1
NEEDLE SPNL 18GX3.5 QUINCKE PK (NEEDLE) ×2 IMPLANT
NEEDLE SPNL 20GX3.5 QUINCKE YW (NEEDLE) ×2 IMPLANT
NS IRRIG 1000ML POUR BTL (IV SOLUTION) ×2 IMPLANT
PACK TOTAL KNEE (MISCELLANEOUS) ×2 IMPLANT
PAD WRAPON POLAR KNEE (MISCELLANEOUS) ×1 IMPLANT
PULSAVAC PLUS IRRIG FAN TIP (DISPOSABLE) ×2
SCALPEL PROTECTED #10 DISP (BLADE) ×4 IMPLANT
SOL .9 NS 3000ML IRR  AL (IV SOLUTION) ×1
SOL .9 NS 3000ML IRR UROMATIC (IV SOLUTION) ×1 IMPLANT
STAPLER SKIN PROX 35W (STAPLE) ×2 IMPLANT
SUCTION FRAZIER HANDLE 10FR (MISCELLANEOUS) ×1
SUCTION TUBE FRAZIER 10FR DISP (MISCELLANEOUS) ×1 IMPLANT
SUT DVC 2 QUILL PDO  T11 36X36 (SUTURE) ×1
SUT DVC 2 QUILL PDO T11 36X36 (SUTURE) ×1 IMPLANT
SUT V-LOC 90 ABS DVC 3-0 CL (SUTURE) ×2 IMPLANT
SYR 20CC LL (SYRINGE) ×2 IMPLANT
SYR 50ML LL SCALE MARK (SYRINGE) ×4 IMPLANT
TIP FAN IRRIG PULSAVAC PLUS (DISPOSABLE) ×1 IMPLANT
TOWEL OR 17X26 4PK STRL BLUE (TOWEL DISPOSABLE) ×2 IMPLANT
TOWER CARTRIDGE SMART MIX (DISPOSABLE) ×2 IMPLANT
TRAY FOLEY MTR SLVR 16FR STAT (SET/KITS/TRAYS/PACK) ×2 IMPLANT
WRAPON POLAR PAD KNEE (MISCELLANEOUS) ×2

## 2018-05-27 NOTE — OR Nursing (Signed)
Surgery cancelled due to scab area over the left knee where incision would be. To be seen in office and reschedule for a later date.

## 2018-06-09 MED ORDER — CLINDAMYCIN PHOSPHATE 900 MG/50ML IV SOLN
900.0000 mg | Freq: Once | INTRAVENOUS | Status: AC
  Administered 2018-06-10: 900 mg via INTRAVENOUS

## 2018-06-10 ENCOUNTER — Inpatient Hospital Stay: Payer: 59 | Admitting: Anesthesiology

## 2018-06-10 ENCOUNTER — Encounter: Payer: Self-pay | Admitting: *Deleted

## 2018-06-10 ENCOUNTER — Inpatient Hospital Stay
Admission: RE | Admit: 2018-06-10 | Discharge: 2018-06-12 | DRG: 470 | Disposition: A | Discharge status: Home Health | Payer: 59 | Attending: Orthopedic Surgery | Admitting: Orthopedic Surgery

## 2018-06-10 ENCOUNTER — Inpatient Hospital Stay: Payer: 59

## 2018-06-10 ENCOUNTER — Other Ambulatory Visit: Payer: Self-pay

## 2018-06-10 ENCOUNTER — Encounter
Admission: RE | Disposition: A | Discharge status: Home Health | Payer: Self-pay | Source: Home / Self Care | Attending: Orthopedic Surgery

## 2018-06-10 DIAGNOSIS — K219 Gastro-esophageal reflux disease without esophagitis: Secondary | ICD-10-CM | POA: Diagnosis present

## 2018-06-10 DIAGNOSIS — Z8781 Personal history of (healed) traumatic fracture: Secondary | ICD-10-CM | POA: Diagnosis not present

## 2018-06-10 DIAGNOSIS — E785 Hyperlipidemia, unspecified: Secondary | ICD-10-CM | POA: Diagnosis present

## 2018-06-10 DIAGNOSIS — Z7982 Long term (current) use of aspirin: Secondary | ICD-10-CM | POA: Diagnosis not present

## 2018-06-10 DIAGNOSIS — Z96698 Presence of other orthopedic joint implants: Secondary | ICD-10-CM | POA: Diagnosis present

## 2018-06-10 DIAGNOSIS — Z6830 Body mass index (BMI) 30.0-30.9, adult: Secondary | ICD-10-CM | POA: Diagnosis not present

## 2018-06-10 DIAGNOSIS — Z96652 Presence of left artificial knee joint: Secondary | ICD-10-CM | POA: Diagnosis not present

## 2018-06-10 DIAGNOSIS — Z7984 Long term (current) use of oral hypoglycemic drugs: Secondary | ICD-10-CM | POA: Diagnosis not present

## 2018-06-10 DIAGNOSIS — M1712 Unilateral primary osteoarthritis, left knee: Principal | ICD-10-CM | POA: Diagnosis present

## 2018-06-10 DIAGNOSIS — Z820 Family history of epilepsy and other diseases of the nervous system: Secondary | ICD-10-CM | POA: Diagnosis not present

## 2018-06-10 DIAGNOSIS — E119 Type 2 diabetes mellitus without complications: Secondary | ICD-10-CM | POA: Diagnosis not present

## 2018-06-10 DIAGNOSIS — Z880 Allergy status to penicillin: Secondary | ICD-10-CM

## 2018-06-10 DIAGNOSIS — Z8249 Family history of ischemic heart disease and other diseases of the circulatory system: Secondary | ICD-10-CM | POA: Diagnosis not present

## 2018-06-10 DIAGNOSIS — E1169 Type 2 diabetes mellitus with other specified complication: Secondary | ICD-10-CM | POA: Diagnosis present

## 2018-06-10 DIAGNOSIS — Z79899 Other long term (current) drug therapy: Secondary | ICD-10-CM | POA: Diagnosis not present

## 2018-06-10 DIAGNOSIS — N528 Other male erectile dysfunction: Secondary | ICD-10-CM | POA: Diagnosis present

## 2018-06-10 DIAGNOSIS — G8918 Other acute postprocedural pain: Secondary | ICD-10-CM

## 2018-06-10 DIAGNOSIS — Z471 Aftercare following joint replacement surgery: Secondary | ICD-10-CM | POA: Diagnosis not present

## 2018-06-10 DIAGNOSIS — Z86010 Personal history of colonic polyps: Secondary | ICD-10-CM

## 2018-06-10 DIAGNOSIS — Z825 Family history of asthma and other chronic lower respiratory diseases: Secondary | ICD-10-CM

## 2018-06-10 DIAGNOSIS — Z8261 Family history of arthritis: Secondary | ICD-10-CM

## 2018-06-10 DIAGNOSIS — I10 Essential (primary) hypertension: Secondary | ICD-10-CM | POA: Diagnosis present

## 2018-06-10 DIAGNOSIS — K2970 Gastritis, unspecified, without bleeding: Secondary | ICD-10-CM | POA: Diagnosis present

## 2018-06-10 DIAGNOSIS — E669 Obesity, unspecified: Secondary | ICD-10-CM | POA: Diagnosis present

## 2018-06-10 HISTORY — PX: TOTAL KNEE ARTHROPLASTY: SHX125

## 2018-06-10 LAB — GLUCOSE, CAPILLARY
GLUCOSE-CAPILLARY: 132 mg/dL — AB (ref 70–99)
GLUCOSE-CAPILLARY: 112 mg/dL — AB (ref 70–99)
GLUCOSE-CAPILLARY: 201 mg/dL — AB (ref 70–99)
Glucose-Capillary: 192 mg/dL — ABNORMAL HIGH (ref 70–99)

## 2018-06-10 LAB — TYPE AND SCREEN
ABO/RH(D): O POS
ANTIBODY SCREEN: NEGATIVE

## 2018-06-10 SURGERY — ARTHROPLASTY, KNEE, TOTAL
Anesthesia: Spinal | Site: Knee | Laterality: Left | Wound class: Clean

## 2018-06-10 MED ORDER — CLINDAMYCIN PHOSPHATE 900 MG/50ML IV SOLN
INTRAVENOUS | Status: AC
  Filled 2018-06-10: qty 50

## 2018-06-10 MED ORDER — METHOCARBAMOL 500 MG PO TABS
500.0000 mg | ORAL_TABLET | Freq: Four times a day (QID) | ORAL | Status: DC | PRN
  Administered 2018-06-11 – 2018-06-12 (×2): 500 mg via ORAL
  Filled 2018-06-10 (×2): qty 1

## 2018-06-10 MED ORDER — LIDOCAINE HCL (PF) 2 % IJ SOLN
INTRAMUSCULAR | Status: AC
  Filled 2018-06-10: qty 10

## 2018-06-10 MED ORDER — BISACODYL 5 MG PO TBEC: 5.0000 mg | DELAYED_RELEASE_TABLET | Freq: Every day | ORAL | Status: DC | PRN

## 2018-06-10 MED ORDER — MORPHINE SULFATE (PF) 2 MG/ML IV SOLN: 0.5000 mg | INTRAVENOUS | Status: DC | PRN

## 2018-06-10 MED ORDER — MIDAZOLAM HCL 2 MG/2ML IJ SOLN
INTRAMUSCULAR | Status: AC
  Filled 2018-06-10: qty 2

## 2018-06-10 MED ORDER — GLYCOPYRROLATE 0.2 MG/ML IJ SOLN
INTRAMUSCULAR | Status: AC
  Filled 2018-06-10: qty 1

## 2018-06-10 MED ORDER — ACETAMINOPHEN 500 MG PO TABS
500.0000 mg | ORAL_TABLET | Freq: Four times a day (QID) | ORAL | Status: AC
  Administered 2018-06-10 – 2018-06-11 (×2): 500 mg via ORAL
  Filled 2018-06-10 (×2): qty 1

## 2018-06-10 MED ORDER — HYDROCODONE-ACETAMINOPHEN 7.5-325 MG PO TABS
1.0000 | ORAL_TABLET | ORAL | Status: DC | PRN
  Administered 2018-06-10 – 2018-06-11 (×4): 2 via ORAL
  Filled 2018-06-10 (×5): qty 2

## 2018-06-10 MED ORDER — BUPIVACAINE-EPINEPHRINE (PF) 0.25% -1:200000 IJ SOLN
INTRAMUSCULAR | Status: DC | PRN
  Administered 2018-06-10: 30 mL

## 2018-06-10 MED ORDER — TRANEXAMIC ACID 1000 MG/10ML IV SOLN
1000.0000 mg | INTRAVENOUS | Status: DC
  Filled 2018-06-10: qty 10

## 2018-06-10 MED ORDER — PROPOFOL 500 MG/50ML IV EMUL
INTRAVENOUS | Status: DC | PRN
  Administered 2018-06-10: 50 ug/kg/min via INTRAVENOUS

## 2018-06-10 MED ORDER — METHOCARBAMOL 1000 MG/10ML IJ SOLN
500.0000 mg | Freq: Four times a day (QID) | INTRAVENOUS | Status: DC | PRN
  Filled 2018-06-10: qty 5

## 2018-06-10 MED ORDER — PROPOFOL 500 MG/50ML IV EMUL
INTRAVENOUS | Status: AC
  Filled 2018-06-10: qty 50

## 2018-06-10 MED ORDER — FENTANYL CITRATE (PF) 100 MCG/2ML IJ SOLN
INTRAMUSCULAR | Status: DC | PRN
  Administered 2018-06-10 (×2): 50 ug via INTRAVENOUS

## 2018-06-10 MED ORDER — NEOMYCIN-POLYMYXIN B GU 40-200000 IR SOLN
Status: DC | PRN
  Administered 2018-06-10: 14 mL

## 2018-06-10 MED ORDER — FENTANYL CITRATE (PF) 100 MCG/2ML IJ SOLN: 25.0000 ug | INTRAMUSCULAR | Status: DC | PRN

## 2018-06-10 MED ORDER — GLYCOPYRROLATE 0.2 MG/ML IJ SOLN
INTRAMUSCULAR | Status: DC | PRN
  Administered 2018-06-10: 0.2 mg via INTRAVENOUS

## 2018-06-10 MED ORDER — MORPHINE SULFATE 10 MG/ML IJ SOLN
INTRAMUSCULAR | Status: DC | PRN
  Administered 2018-06-10: 10 mg

## 2018-06-10 MED ORDER — GABAPENTIN 300 MG PO CAPS
300.0000 mg | ORAL_CAPSULE | Freq: Three times a day (TID) | ORAL | Status: DC
  Administered 2018-06-10 – 2018-06-12 (×6): 300 mg via ORAL
  Filled 2018-06-10 (×6): qty 1

## 2018-06-10 MED ORDER — ONDANSETRON HCL 4 MG/2ML IJ SOLN: 4.0000 mg | Freq: Once | INTRAMUSCULAR | Status: DC | PRN

## 2018-06-10 MED ORDER — AMLODIPINE BESYLATE 10 MG PO TABS
10.0000 mg | ORAL_TABLET | Freq: Every day | ORAL | Status: DC
  Administered 2018-06-10 – 2018-06-11 (×2): 10 mg via ORAL
  Filled 2018-06-10 (×3): qty 1

## 2018-06-10 MED ORDER — HYDROCHLOROTHIAZIDE 25 MG PO TABS: 12.5000 mg | ORAL_TABLET | Freq: Every day | ORAL | Status: DC

## 2018-06-10 MED ORDER — MENTHOL 3 MG MT LOZG
1.0000 | LOZENGE | OROMUCOSAL | Status: DC | PRN
  Filled 2018-06-10: qty 9

## 2018-06-10 MED ORDER — SODIUM CHLORIDE 0.9 % IV SOLN
INTRAVENOUS | Status: DC
  Administered 2018-06-10 (×2): via INTRAVENOUS

## 2018-06-10 MED ORDER — BUPIVACAINE-EPINEPHRINE (PF) 0.25% -1:200000 IJ SOLN
INTRAMUSCULAR | Status: AC
  Filled 2018-06-10: qty 30

## 2018-06-10 MED ORDER — ONDANSETRON HCL 4 MG/2ML IJ SOLN: 4.0000 mg | Freq: Four times a day (QID) | INTRAMUSCULAR | Status: DC | PRN

## 2018-06-10 MED ORDER — EPHEDRINE SULFATE 50 MG/ML IJ SOLN
INTRAMUSCULAR | Status: DC | PRN
  Administered 2018-06-10 (×2): 10 mg via INTRAVENOUS

## 2018-06-10 MED ORDER — PANTOPRAZOLE SODIUM 40 MG PO TBEC
40.0000 mg | DELAYED_RELEASE_TABLET | Freq: Every day | ORAL | Status: DC
  Administered 2018-06-11 – 2018-06-12 (×2): 40 mg via ORAL
  Filled 2018-06-10 (×2): qty 1

## 2018-06-10 MED ORDER — TRANEXAMIC ACID 1000 MG/10ML IV SOLN
INTRAVENOUS | Status: DC | PRN
  Administered 2018-06-10: 1000 mg via INTRAVENOUS

## 2018-06-10 MED ORDER — ALUM & MAG HYDROXIDE-SIMETH 200-200-20 MG/5ML PO SUSP: 30.0000 mL | ORAL | Status: DC | PRN

## 2018-06-10 MED ORDER — LOSARTAN POTASSIUM 50 MG PO TABS
100.0000 mg | ORAL_TABLET | Freq: Every day | ORAL | Status: DC
  Administered 2018-06-11: 100 mg via ORAL
  Filled 2018-06-10: qty 2

## 2018-06-10 MED ORDER — MAGNESIUM HYDROXIDE 400 MG/5ML PO SUSP
30.0000 mL | Freq: Every day | ORAL | Status: DC | PRN
  Administered 2018-06-11: 30 mL via ORAL
  Filled 2018-06-10: qty 30

## 2018-06-10 MED ORDER — EMPAGLIFLOZIN-METFORMIN HCL ER 12.5-1000 MG PO TB24: 1.0000 | ORAL_TABLET | Freq: Two times a day (BID) | ORAL | Status: DC

## 2018-06-10 MED ORDER — DIPHENHYDRAMINE HCL 12.5 MG/5ML PO ELIX: 12.5000 mg | ORAL_SOLUTION | ORAL | Status: DC | PRN

## 2018-06-10 MED ORDER — MIDAZOLAM HCL 5 MG/5ML IJ SOLN
INTRAMUSCULAR | Status: DC | PRN
  Administered 2018-06-10 (×2): 2 mg via INTRAVENOUS

## 2018-06-10 MED ORDER — ASPIRIN EC 325 MG PO TBEC
325.0000 mg | DELAYED_RELEASE_TABLET | Freq: Every day | ORAL | Status: DC
  Administered 2018-06-11 – 2018-06-12 (×2): 325 mg via ORAL
  Filled 2018-06-10 (×2): qty 1

## 2018-06-10 MED ORDER — SODIUM CHLORIDE 0.9 % IJ SOLN
INTRAMUSCULAR | Status: AC
  Filled 2018-06-10: qty 50

## 2018-06-10 MED ORDER — DOCUSATE SODIUM 100 MG PO CAPS
100.0000 mg | ORAL_CAPSULE | Freq: Two times a day (BID) | ORAL | Status: DC
  Administered 2018-06-10 – 2018-06-12 (×4): 100 mg via ORAL
  Filled 2018-06-10 (×4): qty 1

## 2018-06-10 MED ORDER — MORPHINE SULFATE (PF) 10 MG/ML IV SOLN
INTRAVENOUS | Status: AC
  Filled 2018-06-10: qty 1

## 2018-06-10 MED ORDER — ACETAMINOPHEN 325 MG PO TABS: 325.0000 mg | ORAL_TABLET | Freq: Four times a day (QID) | ORAL | Status: DC | PRN

## 2018-06-10 MED ORDER — LINAGLIPTIN 5 MG PO TABS
5.0000 mg | ORAL_TABLET | Freq: Every day | ORAL | Status: DC
  Administered 2018-06-10: 5 mg via ORAL
  Filled 2018-06-10 (×2): qty 1

## 2018-06-10 MED ORDER — TRAMADOL HCL 50 MG PO TABS
50.0000 mg | ORAL_TABLET | Freq: Four times a day (QID) | ORAL | Status: DC
  Administered 2018-06-10 – 2018-06-12 (×3): 50 mg via ORAL
  Filled 2018-06-10 (×3): qty 1

## 2018-06-10 MED ORDER — MAGNESIUM CITRATE PO SOLN
1.0000 | Freq: Once | ORAL | Status: DC | PRN
  Filled 2018-06-10: qty 296

## 2018-06-10 MED ORDER — BUPIVACAINE LIPOSOME 1.3 % IJ SUSP
INTRAMUSCULAR | Status: AC
  Filled 2018-06-10: qty 20

## 2018-06-10 MED ORDER — SODIUM CHLORIDE 0.9 % IV SOLN
INTRAVENOUS | Status: DC | PRN
  Administered 2018-06-10: 60 mL

## 2018-06-10 MED ORDER — FENTANYL CITRATE (PF) 100 MCG/2ML IJ SOLN
INTRAMUSCULAR | Status: AC
  Filled 2018-06-10: qty 2

## 2018-06-10 MED ORDER — PROPOFOL 10 MG/ML IV BOLUS
INTRAVENOUS | Status: AC
  Filled 2018-06-10: qty 20

## 2018-06-10 MED ORDER — ROSUVASTATIN CALCIUM 10 MG PO TABS
20.0000 mg | ORAL_TABLET | Freq: Every day | ORAL | Status: DC
  Administered 2018-06-10 – 2018-06-12 (×3): 20 mg via ORAL
  Filled 2018-06-10 (×3): qty 2

## 2018-06-10 MED ORDER — METOCLOPRAMIDE HCL 5 MG/ML IJ SOLN: 5.0000 mg | Freq: Three times a day (TID) | INTRAMUSCULAR | Status: DC | PRN

## 2018-06-10 MED ORDER — BUPIVACAINE HCL (PF) 0.5 % IJ SOLN
INTRAMUSCULAR | Status: DC | PRN
  Administered 2018-06-10: 3 mL via INTRATHECAL

## 2018-06-10 MED ORDER — HYDROCODONE-ACETAMINOPHEN 5-325 MG PO TABS
1.0000 | ORAL_TABLET | ORAL | Status: DC | PRN
  Administered 2018-06-11: 2 via ORAL
  Filled 2018-06-10 (×2): qty 2

## 2018-06-10 MED ORDER — LIDOCAINE HCL (PF) 2 % IJ SOLN
INTRAMUSCULAR | Status: DC | PRN
  Administered 2018-06-10: 50 mg

## 2018-06-10 MED ORDER — SODIUM CHLORIDE 0.9 % IV SOLN
INTRAVENOUS | Status: DC
  Administered 2018-06-10: 15:00:00 via INTRAVENOUS

## 2018-06-10 MED ORDER — CLINDAMYCIN PHOSPHATE 900 MG/50ML IV SOLN
900.0000 mg | Freq: Four times a day (QID) | INTRAVENOUS | Status: AC
  Administered 2018-06-10 – 2018-06-11 (×3): 900 mg via INTRAVENOUS
  Filled 2018-06-10 (×3): qty 50

## 2018-06-10 MED ORDER — PHENYLEPHRINE HCL 10 MG/ML IJ SOLN
INTRAMUSCULAR | Status: DC | PRN
  Administered 2018-06-10 (×4): 100 ug via INTRAVENOUS

## 2018-06-10 MED ORDER — PHENOL 1.4 % MT LIQD
1.0000 | OROMUCOSAL | Status: DC | PRN
  Filled 2018-06-10: qty 177

## 2018-06-10 MED ORDER — KETAMINE HCL 50 MG/ML IJ SOLN
INTRAMUSCULAR | Status: DC | PRN
  Administered 2018-06-10: 50 mg via INTRAVENOUS

## 2018-06-10 MED ORDER — INSULIN ASPART 100 UNIT/ML ~~LOC~~ SOLN
0.0000 [IU] | Freq: Three times a day (TID) | SUBCUTANEOUS | Status: DC
  Administered 2018-06-10: 5 [IU] via SUBCUTANEOUS
  Administered 2018-06-11 (×3): 3 [IU] via SUBCUTANEOUS
  Administered 2018-06-12: 2 [IU] via SUBCUTANEOUS
  Administered 2018-06-12: 5 [IU] via SUBCUTANEOUS
  Filled 2018-06-10 (×6): qty 1

## 2018-06-10 MED ORDER — NEOMYCIN-POLYMYXIN B GU 40-200000 IR SOLN
Status: AC
  Filled 2018-06-10: qty 20

## 2018-06-10 MED ORDER — METOCLOPRAMIDE HCL 10 MG PO TABS: 5.0000 mg | ORAL_TABLET | Freq: Three times a day (TID) | ORAL | Status: DC | PRN

## 2018-06-10 MED ORDER — ONDANSETRON HCL 4 MG PO TABS: 4.0000 mg | ORAL_TABLET | Freq: Four times a day (QID) | ORAL | Status: DC | PRN

## 2018-06-10 MED ORDER — ZOLPIDEM TARTRATE 5 MG PO TABS
5.0000 mg | ORAL_TABLET | Freq: Every evening | ORAL | Status: DC | PRN
  Administered 2018-06-10 – 2018-06-11 (×2): 5 mg via ORAL
  Filled 2018-06-10 (×2): qty 1

## 2018-06-10 SURGICAL SUPPLY — 64 items
BANDAGE ACE 6X5 VEL STRL LF (GAUZE/BANDAGES/DRESSINGS) ×2 IMPLANT
BLADE SAW 1 (BLADE) ×2 IMPLANT
BLOCK CUTTING TIBIAL 5 LT (MISCELLANEOUS) ×2 IMPLANT
CANISTER SUCT 1200ML W/VALVE (MISCELLANEOUS) ×2 IMPLANT
CANISTER SUCT 3000ML PPV (MISCELLANEOUS) ×4 IMPLANT
CEMENT HV SMART SET (Cement) ×4 IMPLANT
CHLORAPREP W/TINT 26ML (MISCELLANEOUS) ×4 IMPLANT
COOLER POLAR GLACIER W/PUMP (MISCELLANEOUS) ×2 IMPLANT
CUFF TOURN 24 STER (MISCELLANEOUS) IMPLANT
CUFF TOURN 30 STER DUAL PORT (MISCELLANEOUS) IMPLANT
DRAPE SHEET LG 3/4 BI-LAMINATE (DRAPES) ×4 IMPLANT
ELECT CAUTERY BLADE 6.4 (BLADE) ×2 IMPLANT
ELECT REM PT RETURN 9FT ADLT (ELECTROSURGICAL) ×2
ELECTRODE REM PT RTRN 9FT ADLT (ELECTROSURGICAL) ×1 IMPLANT
FEM COMP 5 LT CEMENT 02120005L (Femur) ×2 IMPLANT
FEMUR BONE MODEL 4.9010 MEDACT (MISCELLANEOUS) ×2 IMPLANT
GAUZE PETRO XEROFOAM 1X8 (MISCELLANEOUS) ×2 IMPLANT
GAUZE SPONGE 4X4 12PLY STRL (GAUZE/BANDAGES/DRESSINGS) ×2 IMPLANT
GLOVE BIOGEL PI IND STRL 9 (GLOVE) ×1 IMPLANT
GLOVE BIOGEL PI INDICATOR 9 (GLOVE) ×1
GLOVE INDICATOR 8.0 STRL GRN (GLOVE) ×2 IMPLANT
GLOVE SURG ORTHO 8.0 STRL STRW (GLOVE) ×2 IMPLANT
GLOVE SURG SYN 9.0  PF PI (GLOVE) ×1
GLOVE SURG SYN 9.0 PF PI (GLOVE) ×1 IMPLANT
GOWN SRG 2XL LVL 4 RGLN SLV (GOWNS) ×1 IMPLANT
GOWN STRL NON-REIN 2XL LVL4 (GOWNS) ×1
GOWN STRL REUS W/ TWL LRG LVL3 (GOWN DISPOSABLE) ×1 IMPLANT
GOWN STRL REUS W/ TWL XL LVL3 (GOWN DISPOSABLE) ×1 IMPLANT
GOWN STRL REUS W/TWL LRG LVL3 (GOWN DISPOSABLE) ×1
GOWN STRL REUS W/TWL XL LVL3 (GOWN DISPOSABLE) ×1
HOLDER FOLEY CATH W/STRAP (MISCELLANEOUS) ×2 IMPLANT
HOOD PEEL AWAY FLYTE STAYCOOL (MISCELLANEOUS) ×4 IMPLANT
INSERT TIBIAL FIXED SZ5 LT 12M (Insert) ×2 IMPLANT
KIT TURNOVER KIT A (KITS) ×2 IMPLANT
KNIFE SCULPS 14X20 (INSTRUMENTS) ×2 IMPLANT
NDL SAFETY ECLIPSE 18X1.5 (NEEDLE) ×1 IMPLANT
NEEDLE HYPO 18GX1.5 SHARP (NEEDLE) ×1
NEEDLE SPNL 18GX3.5 QUINCKE PK (NEEDLE) ×2 IMPLANT
NEEDLE SPNL 20GX3.5 QUINCKE YW (NEEDLE) ×2 IMPLANT
NS IRRIG 1000ML POUR BTL (IV SOLUTION) ×2 IMPLANT
PACK TOTAL KNEE (MISCELLANEOUS) ×2 IMPLANT
PAD WRAPON POLAR KNEE (MISCELLANEOUS) ×1 IMPLANT
PATELLA RESURFACING MEDACTA SZ (Bone Implant) ×2 IMPLANT
PULSAVAC PLUS IRRIG FAN TIP (DISPOSABLE) ×2
SCALPEL PROTECTED #10 DISP (BLADE) ×4 IMPLANT
SOL .9 NS 3000ML IRR  AL (IV SOLUTION) ×1
SOL .9 NS 3000ML IRR UROMATIC (IV SOLUTION) ×1 IMPLANT
STAPLER SKIN PROX 35W (STAPLE) ×2 IMPLANT
STEM EXTENSION 11MMX30MM (Stem) ×2 IMPLANT
SUCTION FRAZIER HANDLE 10FR (MISCELLANEOUS) ×1
SUCTION TUBE FRAZIER 10FR DISP (MISCELLANEOUS) ×1 IMPLANT
SUT DVC 2 QUILL PDO  T11 36X36 (SUTURE) ×1
SUT DVC 2 QUILL PDO T11 36X36 (SUTURE) ×1 IMPLANT
SUT V-LOC 90 ABS DVC 3-0 CL (SUTURE) ×2 IMPLANT
SYR 20CC LL (SYRINGE) ×2 IMPLANT
SYR 50ML LL SCALE MARK (SYRINGE) ×4 IMPLANT
TIB TRAY FIXED SZ5 L (Joint) ×2 IMPLANT
TIBIAL BONE MODEL LEFT (MISCELLANEOUS) ×2 IMPLANT
TIP FAN IRRIG PULSAVAC PLUS (DISPOSABLE) ×1 IMPLANT
TOWEL OR 17X26 4PK STRL BLUE (TOWEL DISPOSABLE) ×2 IMPLANT
TOWER CARTRIDGE SMART MIX (DISPOSABLE) ×2 IMPLANT
TRAY FOLEY MTR SLVR 16FR STAT (SET/KITS/TRAYS/PACK) ×2 IMPLANT
WRAPON POLAR PAD KNEE (MISCELLANEOUS) ×2
femur cutting block efficiency ×2 IMPLANT

## 2018-06-10 NOTE — Op Note (Signed)
06/10/2018  11:53 AM  PATIENT:  Jerry Morgan  57 y.o. male  PRE-OPERATIVE DIAGNOSIS:  PRIMARY LOCALIZED OSTEOARTHRITIS OF LEFT KNEE  POST-OPERATIVE DIAGNOSIS:  PRIMARY LOCALIZED OSTEOARTHRITIS OF LEFT KNEE  PROCEDURE:  Procedure(s): TOTAL KNEE ARTHROPLASTY (Left)  SURGEON: Laurene Footman, MD  ASSISTANTS: Rachelle Hora, PA-C  ANESTHESIA:   spinal  EBL:  Total I/O In: 1200 [I.V.:1200] Out: 300 [Urine:250; Blood:50]  BLOOD ADMINISTERED:none  DRAINS: none   LOCAL MEDICATIONS USED:  MARCAINE    and OTHER Exparel and morphine  SPECIMEN:  No Specimen  DISPOSITION OF SPECIMEN:  N/A  COUNTS:  YES  TOURNIQUET: 59 minutes at 300 mmHg  IMPLANTS: Medacta GMK sphere system, left 5 femur, left 5 tibia insert with short stem and a millimeter insert, size 3 patella, all components cemented  DICTATION: .Dragon Dictation  patient was brought to the operating room and after adequate anesthesia was obtainedrightlegwas prepped and draped in thesterile fashion with tourniquet applied to theleftupper thigh. After patient identification and timeout procedure was completed thetourniquet was raised and midline skin incision was made followed by medial parapatellar arthrotomy. Inspection revealed significant degenerative change of medial compartment, there was exposed bone and eburnation of the medial compartment both femoral and tibial condyleswith moderate mild lateral and patellofemoral compartmentosteoarthritis. There is also synovitis present within the joint which was excised. The ACL, PCL and fat pad were excised as well. The proximal tibia was exposed and themy knee cutting guide was applied proximal tibia cut carried out.Distal femoral cutting guide applied in a similar fashion and distal femoral cut carried outThe femur was then prepared withsize5 cutting guide withanterior posterior and chamfer cuts made with appropriate size cuts and this appeared to fit well the  femoraltrial was placed distal drill holes made followed by the trochlear groove cut, as well as box cut for the PS implant. The tibia was prepared using the tibial template baseplate size 5 this was pinned into position proximal tibial preparation carried out for short stem. A 21mm insert gave excellent stability and full extension. The trials were removed at this point and the bony surfaces thoroughly irrigated and dried after the patellar cutting been carried out with the patellar cutting guide drill holes made and sized to a size 3. The above local was then infiltrated in the periarticular tissues and the bony surfaces dried the tibial component was cemented to place first with excess cement removed followed by the polyethylene component with the set screw and a torque screwdriver. The femoral component was placed in the knee held in extension as the cement set with the patellar button clamped into place. After the cement hadset,excess cement was removed and the knee was thoroughly again thoroughly irrigated with tourniquet let down.  The arthrotomy was then repaired using heavy Brion Aliment,.3-0 v-loccuticular followed by skin staples.Woundwas then covered with Xeroform 4 x 4's ABD web roll Polar Care and Ace wrap  PLAN OF CARE: Admit to inpatient   PATIENT DISPOSITION:  PACU - hemodynamically stable.

## 2018-06-10 NOTE — Anesthesia Procedure Notes (Signed)
Spinal  Patient location during procedure: OR Staffing Anesthesiologist: Thomas, Mathai, MD Resident/CRNA: Kinzey Sheriff, CRNA Performed: resident/CRNA  Preanesthetic Checklist Completed: patient identified, site marked, surgical consent, pre-op evaluation, timeout performed, IV checked, risks and benefits discussed and monitors and equipment checked Spinal Block Patient position: sitting Prep: ChloraPrep and site prepped and draped Patient monitoring: heart rate, continuous pulse ox, blood pressure and cardiac monitor Approach: midline Location: L4-5 Injection technique: single-shot Needle Needle type: Introducer and Pencan  Needle gauge: 24 G Needle length: 9 cm Additional Notes Negative paresthesia. Negative blood return. Positive free-flowing CSF. Expiration date of kit checked and confirmed. Patient tolerated procedure well, without complications.       

## 2018-06-10 NOTE — Anesthesia Post-op Follow-up Note (Signed)
Anesthesia QCDR form completed.        

## 2018-06-10 NOTE — Progress Notes (Signed)
Physical Therapy Evaluation Patient Details Name: Jerry Morgan MRN: [id number] DOB: [birth year] Today's Date: 06/10/2018   History of Present Illness  Pt underwent L TKR without reported post-op complications. PT evaluation performed POD#0. PMH includes DM, OA, GERD, and HTN  Clinical Impression  Pt admitted with above diagnosis. Pt currently with functional limitations due to the deficits listed below (see PT Problem List). Pt requires minA+1 to assist LLE when coming to EOB. Slow speed. HOB elevated and use of bed rails. Pt requires cues for safe hand placement during transfers. Decreased weight shifting to LLE during transfer. Once upright pt is steady with UE support on rolling walker. Pt is able to ambulate short distance from bed to recliner. Cues and education for proper sequencing with forward and retro ambulation. Decreased weight shifting to LLE and increase in pain reported. AAROM is -9 to 74 degrees, limited in flexion by pain. Pt will likely be appropriate to return home with [facility] PT and family support at discharge. Pt will benefit from PT services to address deficits in strength, balance, and mobility in order to return to full function at home.      Follow Up Recommendations Home health PT    Equipment Recommendations  None recommended by PT;Other (comment)(Use rolling walker at discharge)    Recommendations for Other Services       Precautions / Restrictions Precautions Precautions: Knee Precaution Booklet Issued: Yes (comment) Restrictions Weight Bearing Restrictions: Yes LLE Weight Bearing: Weight bearing as tolerated      Mobility  Bed Mobility Overal bed mobility: Needs Assistance Bed Mobility: Supine to Sit     Supine to sit: Min assist     General bed mobility comments: Pt requires minA+1 to assist LLE when coming to EOB. Slow speed. HOB elevated and use of bed rails  Transfers Overall transfer level: Needs assistance Equipment used: Rolling walker  (2 wheeled) Transfers: Sit to/from Stand Sit to Stand: Min guard         General transfer comment: Pt requires cues for safe hand placement. Decreased weight shifting to LLE during transfer. Once upright pt is steady with UE support on rolling walker  Ambulation/Gait Ambulation/Gait assistance: Min guard Gait Distance (Feet): 3 Feet Assistive device: Rolling walker (2 wheeled)       General Gait Details: Pt is able to ambulate short distance from bed to recliner. Cues and education for proper sequencing with forward and retro ambulation. Decreased weight shifting to LLE and increase in pain reported  Stairs            Wheelchair Mobility    Modified Rankin (Stroke Patients Only)       Balance Overall balance assessment: Needs assistance Sitting-balance support: No upper extremity supported Sitting balance-Leahy Scale: Good     Standing balance support: Bilateral upper extremity supported Standing balance-Leahy Scale: Fair                               Pertinent Vitals/Pain Pain Assessment: 0-10 Pain Score: 6  Pain Location: L knee Pain Descriptors / Indicators: Operative site guarding Pain Intervention(s): Monitored during session;Premedicated before session    Home Living Family/patient expects to be discharged to:: Private residence Living Arrangements: Spouse/significant other Available Help at Discharge: Family;Available PRN/intermittently Type of Home: House Home Access: Ramped entrance     Home Layout: One level Home Equipment: Walker - 2 wheels;Walker - 4 wheels;Shower seat - built in Additional Comments: Wife  works at [employer] Level of Independence: Independent         Comments: Independent with ADLs/IADLs. Drives. 2 falls in the last 12 months due to L knee buckling     Hand Dominance   Dominant Hand: Right    Extremity/Trunk Assessment   Upper Extremity Assessment Upper Extremity Assessment: Overall  WFL for tasks assessed    Lower Extremity Assessment Lower Extremity Assessment: LLE deficits/detail LLE Deficits / Details: Requires 2 finger assist for SLR. Full SAQ without assistance. Full DF/PF and pt reports fully intact sensation       Communication   Communication: No difficulties  Cognition Arousal/Alertness: Awake/alert Behavior During Therapy: WFL for tasks assessed/performed Overall Cognitive Status: Within Functional Limits for tasks assessed                                        General Comments      Exercises Total Joint Exercises Ankle Circles/Pumps: Both;10 reps Quad Sets: Both;10 reps Gluteal Sets: Both;10 reps Towel Squeeze: Both;10 reps Short Arc Quad: Left;10 reps Heel Slides: Left;10 reps Hip ABduction/ADduction: Left;10 reps Straight Leg Raises: Left;10 reps Goniometric ROM: -9 to 74 degrees AAROM, pain limited   Assessment/Plan    PT Assessment Patient needs continued PT services  PT Problem List Decreased strength;Decreased range of motion;Decreased activity tolerance;Decreased balance;Decreased mobility       PT Treatment Interventions DME instruction;Gait training;Functional mobility training;Therapeutic activities;Therapeutic exercise;Balance training;Neuromuscular re-education;Patient/family education    PT Goals (Current goals can be found in the Care Plan section)  Acute Rehab PT Goals Patient Stated Goal: Return to prior level of function PT Goal Formulation: With patient/family Time For Goal Achievement: 06/24/18 Potential to Achieve Goals: Good    Frequency BID   Barriers to discharge        Co-evaluation               AM-PAC PT "6 Clicks" Daily Activity  Outcome Measure Difficulty turning over in bed (including adjusting bedclothes, sheets and blankets)?: None Difficulty moving from lying on back to sitting on the side of the bed? : Unable Difficulty sitting down on and standing up from a chair with  arms (e.g., wheelchair, bedside commode, etc,.)?: A Little Help needed moving to and from a bed to chair (including a wheelchair)?: A Little Help needed walking in hospital room?: A Little Help needed climbing 3-5 steps with a railing? : Total 6 Click Score: 15    End of Session Equipment Utilized During Treatment: Gait belt Activity Tolerance: Patient tolerated treatment well Patient left: in chair;with call bell/phone within reach;with chair alarm set;with SCD's reapplied;Other (comment)(towel roll under heel, polar care in place) Nurse Communication: Mobility status PT Visit Diagnosis: Unsteadiness on feet (R26.81);Muscle weakness (generalized) (M62.81);History of falling (Z91.81);Difficulty in walking, not elsewhere classified (R26.2);Pain Pain - Right/Left: Left Pain - part of body: Knee    Time: 1510-1540 PT Time Calculation (min) (ACUTE ONLY): 30 min   Charges:   PT Evaluation $PT Eval Low Complexity: 1 Low PT Treatments $Therapeutic Exercise: 8-22 mins        Lyndel Safe Louetta Hollingshead PT, DPT, GCS   Deke Tilghman 06/10/2018, 4:21 PM

## 2018-06-10 NOTE — Anesthesia Preprocedure Evaluation (Signed)
Anesthesia Evaluation  Patient identified by MRN, date of birth, ID band Patient awake    Reviewed: Allergy & Precautions, NPO status , Patient's Chart, lab work & pertinent test results, reviewed documented beta blocker date and time   Airway Mallampati: III  TM Distance: >3 FB     Dental  (+) Chipped   Pulmonary           Cardiovascular hypertension, Pt. on medications      Neuro/Psych    GI/Hepatic GERD  Controlled,  Endo/Other  diabetes, Type 2  Renal/GU      Musculoskeletal  (+) Arthritis ,   Abdominal   Peds  Hematology   Anesthesia Other Findings Obese.  Reproductive/Obstetrics                             Anesthesia Physical Anesthesia Plan  ASA: III  Anesthesia Plan: Spinal   Post-op Pain Management:    Induction:   PONV Risk Score and Plan:   Airway Management Planned:   Additional Equipment:   Intra-op Plan:   Post-operative Plan:   Informed Consent: I have reviewed the patients History and Physical, chart, labs and discussed the procedure including the risks, benefits and alternatives for the proposed anesthesia with the patient or authorized representative who has indicated his/her understanding and acceptance.     Plan Discussed with: CRNA  Anesthesia Plan Comments:         Anesthesia Quick Evaluation

## 2018-06-10 NOTE — OR Nursing (Signed)
Gave Dr Marcello Moores status update, pt  At T2 and can clinch his thighs bilaterally as well as move his feet. States okay to move to floor.

## 2018-06-10 NOTE — Progress Notes (Signed)
Pt arrived to room 160 from PACU. Skin assessment completed with Andi Hence, RN. Phone and call bell within reach. Pt alert and oriented X4. Wife at bedside. Polar care on and running. IV infusing NS. Pt on room air.

## 2018-06-10 NOTE — H&P (Signed)
Reviewed paper H+P, will be scanned into chart. No changes noted.  

## 2018-06-10 NOTE — NC FL2 (Signed)
[place] LEVEL OF CARE SCREENING TOOL     IDENTIFICATION  Patient Name: Jerry Morgan Birthdate: May 02, 1961 Sex: male Admission Date (Current Location): 06/10/2018  [place] and [place] Number:  Engineering geologist and Address:  [facility], [address], [place], [place] [address]      Provider Number: [id number]  Attending Physician Name and Address:  Hessie Knows, MD  Relative Name and Phone Number:       Current Level of Care: Hospital Recommended Level of Care: [place] Prior Approval Number:    Date Approved/Denied:   PASRR Number: [id number])  Discharge Plan: SNF    Current Diagnoses: Patient Active Problem List   Diagnosis Date Noted  . Status post total knee replacement using cement, left 06/10/2018  . Avascular necrosis of femoral head, left ([place]) 04/28/2018  . Vaccine counseling 12/12/2017  . Other male erectile dysfunction 07/30/2017  . Hyperlipidemia due to type 2 diabetes mellitus ([place]) 03/28/2017  . Class 1 obesity due to excess calories with serious comorbidity and body mass index (BMI) of 33.0 to 33.9 in adult 03/26/2017  . Heart murmur, systolic 26/37/8588  . Type 2 diabetes mellitus without complication, without long-term current use of insulin ([place]) 03/26/2017  . Blunt chest trauma 09/14/2016  . HTN (hypertension) 09/14/2016  . DM (diabetes mellitus) ([place]) 09/14/2016  . Lymphadenopathy 09/14/2016  . Urinary retention 09/14/2016  . Multiple closed fractures of ribs of left side 09/13/2016    Orientation RESPIRATION BLADDER Height & Weight     Self, Time, Situation, Place  Normal Continent Weight: 222 lb 4.8 oz (100.8 kg) Height:  6' (182.9 cm)  BEHAVIORAL SYMPTOMS/MOOD NEUROLOGICAL BOWEL NUTRITION STATUS      Continent Diet(Diet: Carb Modified. )  AMBULATORY STATUS COMMUNICATION OF NEEDS Skin   Extensive Assist Verbally Surgical wounds(Incision: Left Knee. )                      Personal Care Assistance Level of Assistance  Bathing, Feeding, Dressing Bathing Assistance: Limited assistance Feeding assistance: Independent Dressing Assistance: Limited assistance     Functional Limitations Info  Sight, Hearing, Speech Sight Info: Adequate Hearing Info: Adequate Speech Info: Adequate    SPECIAL CARE FACTORS FREQUENCY  PT (By licensed PT), OT (By licensed OT)     PT Frequency: (5) OT Frequency: (5)            Contractures      Additional Factors Info  Code Status, Allergies Code Status Info: (Full Code. ) Allergies Info: (Penicillins)           Current Medications (06/10/2018):  This is the current hospital active medication list Current Facility-Administered Medications  Medication Dose Route Frequency Provider Last Rate Last Dose  . 0.9 %  sodium chloride infusion   Intravenous Continuous Hessie Knows, MD 75 mL/hr at 06/10/18 1458    . [START ON 06/11/2018] acetaminophen (TYLENOL) tablet 325-650 mg  325-650 mg Oral Q6H PRN Hessie Knows, MD      . acetaminophen (TYLENOL) tablet 500 mg  500 mg Oral Q6H Hessie Knows, MD      . alum & mag hydroxide-simeth (MAALOX/MYLANTA) 200-200-20 MG/5ML suspension 30 mL  30 mL Oral Q4H PRN Hessie Knows, MD      . amLODipine (NORVASC) tablet 10 mg  10 mg Oral Daily Hessie Knows, MD      . Derrill Memo ON 06/11/2018] aspirin EC tablet 325 mg  325 mg Oral Q breakfast  Hessie Knows, MD      . bisacodyl (DULCOLAX) EC tablet 5 mg  5 mg Oral Daily PRN Hessie Knows, MD      . clindamycin (CLEOCIN) IVPB 900 mg  900 mg Intravenous Q6H Hessie Knows, MD      . diphenhydrAMINE (BENADRYL) 12.5 MG/5ML elixir 12.5-25 mg  12.5-25 mg Oral Q4H PRN Hessie Knows, MD      . docusate sodium (COLACE) capsule 100 mg  100 mg Oral BID Hessie Knows, MD      . gabapentin (NEURONTIN) capsule 300 mg  300 mg Oral TID Hessie Knows, MD      . Derrill Memo ON 06/11/2018] hydrochlorothiazide (HYDRODIURIL) tablet 12.5 mg  12.5 mg Oral Daily  Hessie Knows, MD      . HYDROcodone-acetaminophen [facility]) 7.5-325 MG per tablet 1-2 tablet  1-2 tablet Oral Q4H PRN Hessie Knows, MD   2 tablet at 06/10/18 1447  . HYDROcodone-acetaminophen (NORCO/VICODIN) 5-325 MG per tablet 1-2 tablet  1-2 tablet Oral Q4H PRN Hessie Knows, MD      . insulin aspart (novoLOG) injection 0-15 Units  0-15 Units Subcutaneous TID WC Hessie Knows, MD      . linagliptin (TRADJENTA) tablet 5 mg  5 mg Oral Daily Hessie Knows, MD   5 mg at 06/10/18 1438  . [START ON 06/11/2018] losartan (COZAAR) tablet 100 mg  100 mg Oral Q1400 Hessie Knows, MD      . magnesium citrate solution 1 Bottle  1 Bottle Oral Once PRN Hessie Knows, MD      . magnesium hydroxide (MILK OF MAGNESIA) suspension 30 mL  30 mL Oral Daily PRN Hessie Knows, MD      . menthol-cetylpyridinium (CEPACOL) lozenge 3 mg  1 lozenge Oral PRN Hessie Knows, MD       Or  . phenol (CHLORASEPTIC) mouth spray 1 spray  1 spray Mouth/Throat PRN Hessie Knows, MD      . methocarbamol (ROBAXIN) tablet 500 mg  500 mg Oral Q6H PRN Hessie Knows, MD       Or  . methocarbamol (ROBAXIN) 500 mg in dextrose 5 % 50 mL IVPB  500 mg Intravenous Q6H PRN Hessie Knows, MD      . metoCLOPramide (REGLAN) tablet 5-10 mg  5-10 mg Oral Q8H PRN Hessie Knows, MD       Or  . metoCLOPramide (REGLAN) injection 5-10 mg  5-10 mg Intravenous Q8H PRN Hessie Knows, MD      . morphine 2 MG/ML injection 0.5-1 mg  0.5-1 mg Intravenous Q2H PRN Hessie Knows, MD      . ondansetron [facility]) tablet 4 mg  4 mg Oral Q6H PRN Hessie Knows, MD       Or  . ondansetron [facility]) injection 4 mg  4 mg Intravenous Q6H PRN Hessie Knows, MD      . Derrill Memo ON 06/11/2018] pantoprazole (PROTONIX) EC tablet 40 mg  40 mg Oral QAC breakfast Hessie Knows, MD      . rosuvastatin (CRESTOR) tablet 20 mg  20 mg Oral Q1400 Hessie Knows, MD   20 mg at 06/10/18 1447  . traMADol (ULTRAM) tablet 50 mg  50 mg Oral Q6H Hessie Knows, MD      . zolpidem (AMBIEN) tablet 5 mg   5 mg Oral QHS PRN,MR X 1 Hessie Knows, MD         Discharge Medications: Please see discharge summary for a list of discharge medications.  Relevant Imaging Results:  Relevant Lab Results:  Additional Information (SSN: [id number])  Mark Benecke, Veronia Beets, LCSW

## 2018-06-10 NOTE — Transfer of Care (Signed)
Immediate Anesthesia Transfer of Care Note  Patient: Jerry Morgan  Procedure(s) Performed: TOTAL KNEE ARTHROPLASTY (Left Knee)  Patient Location: PACU  Anesthesia Type:Spinal  Level of Consciousness: awake and alert   Airway & Oxygen Therapy: Patient Spontanous Breathing and Patient connected to nasal cannula oxygen  Post-op Assessment: Report given to RN and Post -op Vital signs reviewed and stable  Post vital signs: Reviewed  Last Vitals:  Vitals Value Taken Time  BP 94/69 06/10/2018 11:49 AM  Temp    Pulse 76 06/10/2018 11:50 AM  Resp 12 06/10/2018 11:50 AM  SpO2 100 % 06/10/2018 11:50 AM  Vitals shown include unvalidated device data.  Last Pain:  Vitals:   06/10/18 0821  TempSrc: Temporal  PainSc: 9          Complications: No apparent anesthesia complications

## 2018-06-11 ENCOUNTER — Encounter: Payer: Self-pay | Admitting: Orthopedic Surgery

## 2018-06-11 LAB — CBC
HCT: 31.5 % — ABNORMAL LOW (ref 40.0–52.0)
Hemoglobin: 10.9 g/dL — ABNORMAL LOW (ref 13.0–18.0)
MCH: 33 pg (ref 26.0–34.0)
MCHC: 34.5 g/dL (ref 32.0–36.0)
MCV: 95.6 fL (ref 80.0–100.0)
PLATELETS: 148 10*3/uL — AB (ref 150–440)
RBC: 3.3 MIL/uL — AB (ref 4.40–5.90)
RDW: 14 % (ref 11.5–14.5)
WBC: 5.2 10*3/uL (ref 3.8–10.6)

## 2018-06-11 LAB — BASIC METABOLIC PANEL
Anion gap: 8 (ref 5–15)
BUN: 14 mg/dL (ref 6–20)
CALCIUM: 8.3 mg/dL — AB (ref 8.9–10.3)
CO2: 23 mmol/L (ref 22–32)
CREATININE: 0.57 mg/dL — AB (ref 0.61–1.24)
Chloride: 102 mmol/L (ref 98–111)
GFR calc Af Amer: >60 mL/min (ref >60)
Glucose, Bld: 194 mg/dL — ABNORMAL HIGH (ref 70–99)
POTASSIUM: 3.8 mmol/L (ref 3.5–5.1)
SODIUM: 133 mmol/L — AB (ref 135–145)

## 2018-06-11 LAB — GLUCOSE, CAPILLARY
GLUCOSE-CAPILLARY: 157 mg/dL — AB (ref 70–99)
Glucose-Capillary: 185 mg/dL — ABNORMAL HIGH (ref 70–99)
Glucose-Capillary: 184 mg/dL — ABNORMAL HIGH (ref 70–99)
Glucose-Capillary: 189 mg/dL — ABNORMAL HIGH (ref 70–99)

## 2018-06-11 MED ORDER — HYDROCHLOROTHIAZIDE 25 MG PO TABS
12.5000 mg | ORAL_TABLET | Freq: Every day | ORAL | Status: DC
  Administered 2018-06-11 – 2018-06-12 (×2): 12.5 mg via ORAL
  Filled 2018-06-11 (×2): qty 1

## 2018-06-11 MED ORDER — TRAMADOL HCL 50 MG PO TABS: 50.0000 mg | ORAL_TABLET | Freq: Four times a day (QID) | ORAL | 0 refills | Status: DC | PRN

## 2018-06-11 MED ORDER — OXYCODONE HCL 5 MG PO TABS
5.0000 mg | ORAL_TABLET | ORAL | Status: DC | PRN
  Administered 2018-06-11 – 2018-06-12 (×4): 10 mg via ORAL
  Filled 2018-06-11 (×5): qty 2

## 2018-06-11 MED ORDER — NON FORMULARY: 1.0000 | Freq: Two times a day (BID) | Status: DC

## 2018-06-11 MED ORDER — EMPAGLIFLOZIN-METFORMIN HCL ER 12.5-1000 MG PO TB24
1.0000 | ORAL_TABLET | Freq: Two times a day (BID) | ORAL | Status: DC
  Administered 2018-06-11 – 2018-06-12 (×3): 1 via ORAL
  Filled 2018-06-11 (×2): qty 1

## 2018-06-11 MED ORDER — HYDROCODONE-ACETAMINOPHEN 5-325 MG PO TABS: 1.0000 | ORAL_TABLET | ORAL | 0 refills | Status: DC | PRN

## 2018-06-11 NOTE — Progress Notes (Signed)
   Subjective: 1 Day Post-Op Procedure(s) (LRB): TOTAL KNEE ARTHROPLASTY (Left) Patient reports pain as 5 on 0-10 scale.   Patient is well, and has had no acute complaints or problems Did will day 1 with therapy.  Was able to get up to chair work on range of motion. Rested well during the night Plan is to go Home after hospital stay. no nausea and no vomiting Patient denies any chest pains or shortness of breath. Objective: Vital signs in last 24 hours: Temp:  [97 F (36.1 C)-98.1 F (36.7 C)] 97.7 F (36.5 C) (09/11 0728) Pulse Rate:  [69-94] 69 (09/11 0728) Resp:  [11-18] 18 (09/11 0356) BP: (94-181)/(69-107) 154/92 (09/11 0728) SpO2:  [96 %-100 %] 99 % (09/11 0728) Weight:  [100.8 kg] 100.8 kg (09/10 0821) Heels are non tender and elevated off the bed using rolled towels Intake/Output from previous day: 09/10 0701 - 09/11 0700 In: 2674 [P.O.:840; I.V.:1784; IV Piggyback:50] Out: 7915 [Urine:1530; Blood:50] Intake/Output this shift: No intake/output data recorded.  Recent Labs    06/11/18 0521  HGB 10.9*   Recent Labs    06/11/18 0521  WBC 5.2  RBC 3.30*  HCT 31.5*  PLT 148*   Recent Labs    06/11/18 0521  NA 133*  K 3.8  CL 102  CO2 23  BUN 14  CREATININE 0.57*  GLUCOSE 194*  CALCIUM 8.3*   No results for input(s): LABPT, INR in the last 72 hours.  EXAM General - Patient is Alert, Appropriate and Oriented Extremity - Neurologically intact Neurovascular intact Sensation intact distally Intact pulses distally Dorsiflexion/Plantar flexion intact Compartment soft Dressing - dressing C/D/I Motor Function - intact, moving foot and toes well on exam.    Past Medical History:  Diagnosis Date  . Arthritis   . GERD (gastroesophageal reflux disease)   . Hypertension   . Rib fracture 09/12/2016   after being jerked against a door frame by his son's dog.  Pt sustained Lt rib fractures 5-10 with flail chest segment 5-7/notes 09/13/2016  . Type II  diabetes mellitus ([place])    dx'd ~ 08/2016    Assessment/Plan: 1 Day Post-Op Procedure(s) (LRB): TOTAL KNEE ARTHROPLASTY (Left) Active Problems:   Status post total knee replacement using cement, left  Estimated body mass index is 30.15 kg/m as calculated from the following:   Height as of this encounter: 6' (1.829 m).   Weight as of this encounter: 100.8 kg. Advance diet Up with therapy D/C IV fluids Plan for discharge tomorrow Discharge home with home health  Labs: Were reviewed and within normal limits DVT Prophylaxis - Aspirin, TED hose and SCD Weight-Bearing as tolerated to left leg D/C O2 and Pulse OX and try on Room Air Begin working on bowel movement  Bera Pinela R. [place] Farmer 06/11/2018, 7:41 AM

## 2018-06-11 NOTE — Plan of Care (Signed)
  Problem: Education: Goal: Knowledge of General Education information will improve Description Including pain rating scale, medication(s)/side effects and non-pharmacologic comfort measures Outcome: Progressing   

## 2018-06-11 NOTE — Progress Notes (Signed)
Pt complaining about blood sugars being higher than normal. Pt usually takes a type of metformin at home but was switched to [place] in the hospital. MD Rudene Christians paged and OK with pt taking his metformin instead. This nurse called pharmacy and was told they do not carry that medication in stock. Pt's wife going home to get pt's medication and this nurse will send down to pharmacy when it arrives.

## 2018-06-11 NOTE — Discharge Instructions (Signed)

## 2018-06-11 NOTE — Discharge Summary (Signed)
Physician Discharge Summary  Patient ID: Jerry Morgan MRN: [id number] DOB/AGE: [birth year] 57 y.o.  Admit date: 06/10/2018 Discharge date: 06/12/2018  Admission Diagnoses:  PRIMARY LOCALIZED OSTEOARTHRITIS OF LEFT KNEE   Discharge Diagnoses: Patient Active Problem List   Diagnosis Date Noted  . Status post total knee replacement using cement, left 06/10/2018  . Avascular necrosis of femoral head, left ([place]) 04/28/2018  . Vaccine counseling 12/12/2017  . Other male erectile dysfunction 07/30/2017  . Hyperlipidemia due to type 2 diabetes mellitus ([place]) 03/28/2017  . Class 1 obesity due to excess calories with serious comorbidity and body mass index (BMI) of 33.0 to 33.9 in adult 03/26/2017  . Heart murmur, systolic 78/67/6720  . Type 2 diabetes mellitus without complication, without long-term current use of insulin ([place]) 03/26/2017  . Blunt chest trauma 09/14/2016  . HTN (hypertension) 09/14/2016  . DM (diabetes mellitus) ([place]) 09/14/2016  . Lymphadenopathy 09/14/2016  . Urinary retention 09/14/2016  . Multiple closed fractures of ribs of left side 09/13/2016    Past Medical History:  Diagnosis Date  . Arthritis   . GERD (gastroesophageal reflux disease)   . Hypertension   . Rib fracture 09/12/2016   after being jerked against a door frame by his son's dog.  Pt sustained Lt rib fractures 5-10 with flail chest segment 5-7/notes 09/13/2016  . Type II diabetes mellitus ([place])    dx'd ~ 08/2016     Transfusion: No transfusions during this admission   Consultants (if any):   Discharged Condition: Improved  Hospital Course: Jerry Morgan is an 57 y.o. male who was admitted 06/10/2018 with a diagnosis of degenerative arthrosis left knee and went to the operating room on 06/10/2018 and underwent the above named procedures.    Surgeries:Procedure(s): TOTAL KNEE ARTHROPLASTY on 06/10/2018  PRE-OPERATIVE DIAGNOSIS:  PRIMARY LOCALIZED OSTEOARTHRITIS OF LEFT  KNEE  POST-OPERATIVE DIAGNOSIS:  PRIMARY LOCALIZED OSTEOARTHRITIS OF LEFT KNEE  PROCEDURE:  Procedure(s): TOTAL KNEE ARTHROPLASTY (Left)  SURGEON: Laurene Footman, MD  ASSISTANTS: Rachelle Hora, PA-C  ANESTHESIA:   spinal  EBL:  Total I/O In: 1200 [I.V.:1200] Out: 300 [Urine:250; Blood:50]  BLOOD ADMINISTERED:none  DRAINS: none   LOCAL MEDICATIONS USED:  MARCAINE    and OTHER Exparel and morphine  SPECIMEN:  No Specimen  DISPOSITION OF SPECIMEN:  N/A  COUNTS:  YES  TOURNIQUET: 59 minutes at 300 mmHg  IMPLANTS: Medacta GMK sphere system, left 5 femur, left 5 tibia insert with short stem and a millimeter insert, size 3 patella, all components cemented Patient tolerated the surgery well. No complications .Patient was taken to PACU where she was stabilized and then transferred to the orthopedic floor.  Patient started on 325 mg aspirin per day. Foot pumps applied bilaterally at 80 mm hgb. Heels elevated off bed with rolled towels. No evidence of DVT. Calves non tender. Negative [place]. Physical therapy started on day #1 for gait training and transfer with OT starting on  day #1 for ADL and assisted devices. Patient has done well with therapy. Ambulated greater than 200 feet upon being discharged.  Was able to ascend and descend 4 steps safely and independently  Patient's IV And Foley were discontinued on day #1 with Hemovac being discontinued on day #2. Dressing was changed on day 2 prior to patient being discharged   He was given perioperative antibiotics:  Anti-infectives (From admission, onward)   Start     Dose/Rate Route Frequency Ordered Stop   06/10/18 1600  clindamycin (CLEOCIN) IVPB 900 mg  900 mg 100 mL/hr over 30 Minutes Intravenous Every 6 hours 06/10/18 1304 06/11/18 0614   06/10/18 0809  clindamycin (CLEOCIN) 900 MG/50ML IVPB    Note to Pharmacy:  Hallaji, Violet   : cabinet override      06/10/18 0809 06/10/18 1006   06/09/18 2200  clindamycin  (CLEOCIN) IVPB 900 mg     900 mg 100 mL/hr over 30 Minutes Intravenous  Once 06/09/18 2148 06/10/18 1026    .  He was fitted with AV 1 compression foot pump devices, instructed on heel pumps, early ambulation, and fitted with TED stockings bilaterally for DVT prophylaxis.  He benefited maximally from the hospital stay and there were no complications.    Recent vital signs:  Vitals:   06/11/18 0356 06/11/18 0728  BP: (!) 171/107 (!) 154/92  Pulse: 75 69  Resp: 18   Temp: 97.7 F (36.5 C) 97.7 F (36.5 C)  SpO2: 98% 99%    Recent laboratory studies:  Lab Results  Component Value Date   HGB 10.9 (L) 06/11/2018   HGB 12.7 (L) 05/14/2018   HGB 13.3 09/17/2016   Lab Results  Component Value Date   WBC 5.2 06/11/2018   PLT 148 (L) 06/11/2018   Lab Results  Component Value Date   INR 0.94 05/14/2018   Lab Results  Component Value Date   NA 133 (L) 06/11/2018   K 3.8 06/11/2018   CL 102 06/11/2018   CO2 23 06/11/2018   BUN 14 06/11/2018   CREATININE 0.57 (L) 06/11/2018   GLUCOSE 194 (H) 06/11/2018    Discharge Medications:   Allergies as of 06/11/2018      Reactions   Penicillins Hives, Other (See Comments)   Has patient had a PCN reaction causing immediate rash, facial/tongue/throat swelling, SOB or lightheadedness with hypotension: yes Has patient had a PCN reaction causing severe rash involving mucus membranes or skin necrosis: no Has patient had a PCN reaction that required hospitalization no Has patient had a PCN reaction occurring within the last 10 years: no If all of the above answers are "NO", then may proceed with Cephalosporin use.      Medication List    TAKE these medications   amLODipine 10 MG tablet Commonly known as:  NORVASC Take 10 mg by mouth daily.   aspirin EC 81 MG tablet Take 81 mg by mouth daily.   diphenhydramine-acetaminophen 25-500 MG Tabs tablet Commonly known as:  TYLENOL PM Take 2 tablets by mouth at bedtime as needed (for  sleep.).   hydrochlorothiazide 12.5 MG tablet Commonly known as:  HYDRODIURIL Take 12.5 mg by mouth daily.   HYDROcodone-acetaminophen 5-325 MG tablet Commonly known as:  NORCO/VICODIN Take 1-2 tablets by mouth every 4 (four) hours as needed for moderate pain (pain score 4-6).   losartan 100 MG tablet Commonly known as:  COZAAR Take 1 tablet (100 mg total) by mouth daily. What changed:  when to take this   pantoprazole 40 MG tablet Commonly known as:  PROTONIX Take 40 mg by mouth daily before breakfast.   rosuvastatin 20 MG tablet Commonly known as:  CRESTOR Take 20 mg by mouth daily at 2 PM.   sildenafil 20 MG tablet Commonly known as:  REVATIO 2-5 tabs 1 hour prior to intercourse What changed:    how much to take  how to take this  when to take this  reasons to take this   sitaGLIPtin 100 MG tablet Commonly known as:  JANUVIA Take 1  tablet (100 mg total) by mouth daily. Gave samples- hold Rx for now What changed:  additional instructions   SYNJARDY XR 12.01-999 MG Tb24 Generic drug:  Empagliflozin-metFORMIN HCl ER Take 1 tablet by mouth 2 (two) times daily.   temazepam 15 MG capsule Commonly known as:  RESTORIL Take 15 mg by mouth at bedtime as needed for sleep.   traMADol 50 MG tablet Commonly known as:  ULTRAM Take 50-100 mg by mouth 3 (three) times daily as needed (for pain). What changed:  Another medication with the same name was added. Make sure you understand how and when to take each.   traMADol 50 MG tablet Commonly known as:  ULTRAM Take 1-2 tablets (50-100 mg total) by mouth every 6 (six) hours as needed. What changed:  You were already taking a medication with the same name, and this prescription was added. Make sure you understand how and when to take each.            Durable Medical Equipment  (From admission, onward)         Start     Ordered   06/10/18 1305  DME Walker rolling  Once    Question:  Patient needs a walker to treat  with the following condition  Answer:  Status post total knee replacement using cement, left   06/10/18 1304   06/10/18 1305  DME 3 n 1  Once     06/10/18 1304   06/10/18 1305  DME Bedside commode  Once    Question:  Patient needs a bedside commode to treat with the following condition  Answer:  Status post total knee replacement using cement, left   06/10/18 1304          Diagnostic Studies: Dg Knee 1-2 Views Left  Result Date: 06/10/2018 CLINICAL DATA:  Post left knee replacement EXAM: LEFT KNEE - 1-2 VIEW COMPARISON:  CT of the left knee of 04/22/2018 FINDINGS: The femoral and tibial components of the left total knee replacement are in good position. No complicating features are seen. A small amount air is noted in the joint space and adjacent soft tissues postoperatively. IMPRESSION: Left total knee replacement components in good position. No complicating feature. Electronically Signed   By: Ivar Drape M.D.   On: 06/10/2018 12:23    Disposition:   Discharge Instructions    Increase activity slowly   Complete by:  As directed          Signed: Mylie Mccurley R. 06/11/2018, 7:50 AM

## 2018-06-11 NOTE — Anesthesia Postprocedure Evaluation (Signed)
Anesthesia Post Note  Patient: Jerry Morgan  Procedure(s) Performed: TOTAL KNEE ARTHROPLASTY (Left Knee)  Patient location during evaluation: Nursing Unit Anesthesia Type: Spinal Level of consciousness: oriented and awake and alert Pain management: pain level controlled Vital Signs Assessment: post-procedure vital signs reviewed and stable Respiratory status: spontaneous breathing Cardiovascular status: blood pressure returned to baseline and stable Postop Assessment: no headache, no backache, no apparent nausea or vomiting, spinal receding and patient able to bend at knees Anesthetic complications: no     Last Vitals:  Vitals:   06/11/18 0356 06/11/18 0728  BP: (!) 171/107 (!) 154/92  Pulse: 75 69  Resp: 18   Temp: 36.5 C 36.5 C  SpO2: 98% 99%    Last Pain:  Vitals:   06/11/18 0819  TempSrc:   PainSc: 8                  Precious Haws Graelyn Bihl

## 2018-06-11 NOTE — Plan of Care (Signed)

## 2018-06-11 NOTE — Evaluation (Signed)
Occupational Therapy Evaluation Patient Details Name: Jerry Morgan MRN: [id number] DOB: 04/10/1961 Today's Date: 06/11/2018    History of Present Illness Pt underwent L TKR without reported post-op complications. PT evaluation performed POD#0. OT evaluation performed POD#1. PMH includes DM, OA, GERD, and HTN   Clinical Impression   Pt seen for OT evaluation this date, POD#1 from above surgery. Pt was independent in all ADLs prior to surgery, however experienced a couple falls 2/2 his L knee buckling. Pt is eager to return to PLOF with less pain and improved safety and independence. Pt currently requires PRN minimal assist for LB dressing and bathing while in seated position due to pain and limited AROM of L knee. Pt instructed in polar care mgt, falls prevention strategies, home/routines modifications, DME/AE for LB bathing and dressing tasks, and compression stocking mgt. Pt verbalized understanding of all education/training provided. Pt has sufficient assist from family planned for when he discharges. Do not currently anticipate any OT needs following this hospitalization.  Will sign off. Please re-consult if additional needs arise.    Follow Up Recommendations  No OT follow up    Equipment Recommendations  3 in 1 bedside commode    Recommendations for Other Services       Precautions / Restrictions Precautions Precautions: Knee Precaution Booklet Issued: Yes (comment) Restrictions Weight Bearing Restrictions: Yes LLE Weight Bearing: Weight bearing as tolerated      Mobility Bed Mobility     General bed mobility comments: deferred, up in recliner  Transfers Overall transfer level: Needs assistance Equipment used: Rolling walker (2 wheeled) Transfers: Sit to/from Stand Sit to Stand: Min guard         General transfer comment: Pt with slower speed this afternoon and decreased weight shift to LLE.     Balance Overall balance assessment: Needs  assistance Sitting-balance support: No upper extremity supported Sitting balance-Leahy Scale: Good     Standing balance support: Bilateral upper extremity supported Standing balance-Leahy Scale: Fair                             ADL either performed or assessed with clinical judgement   ADL Overall ADL's : Needs assistance/impaired Eating/Feeding: Independent   Grooming: Independent   Upper Body Bathing: Sitting;Modified independent   Lower Body Bathing: Sit to/from stand;Minimal assistance;With caregiver independent assisting   Upper Body Dressing : Sitting;Modified independent   Lower Body Dressing: Sit to/from stand;Minimal assistance;With caregiver independent assisting Lower Body Dressing Details (indicate cue type and reason): pt educated in AE for LB dressing and compression stocking mgt   Toilet Transfer Details (indicate cue type and reason): pt educated in use of BSC for overnight and daytime toileting needs                 Vision Patient Visual Report: No change from baseline       Perception     Praxis      Pertinent Vitals/Pain Pain Assessment: 0-10 Pain Score: 7  Pain Location: L knee Pain Descriptors / Indicators: Operative site guarding Pain Intervention(s): Limited activity within patient's tolerance;Monitored during session;Premedicated before session;Repositioned;Ice applied     Hand Dominance Right   Extremity/Trunk Assessment Upper Extremity Assessment Upper Extremity Assessment: Overall WFL for tasks assessed   Lower Extremity Assessment Lower Extremity Assessment: Defer to PT evaluation   Cervical / Trunk Assessment Cervical / Trunk Assessment: Normal   Communication Communication Communication: No difficulties   Cognition Arousal/Alertness:  Awake/alert Behavior During Therapy: WFL for tasks assessed/performed Overall Cognitive Status: Within Functional Limits for tasks assessed                                      General Comments       Exercises Other Exercises Other Exercises: pt instructed in polar care mgt  Other Exercises: pt educated in falls prevention strategies    Shoulder Instructions      Home Living Family/patient expects to be discharged to:: Private residence Living Arrangements: Spouse/significant other Available Help at Discharge: Family;Available PRN/intermittently;Available 24 hours/day(24/7 for 2 weeks then wife goes back to work) Type of Home: House Home Access: Reading: One level     Bathroom Shower/Tub: [place]: Handicapped height     Home Equipment: Environmental consultant - 2 wheels;Walker - 4 wheels;Shower seat - built in   Additional Comments: Wife works at [employer]      Prior Functioning/Environment Level of Independence: Independent        Comments: Independent with ADLs/IADLs. Drives. 2 falls in the last 12 months due to L knee buckling        OT Problem List:        OT Treatment/Interventions:      OT Goals(Current goals can be found in the care plan section) Acute Rehab OT Goals Patient Stated Goal: Return to prior level of function OT Goal Formulation: All assessment and education complete, DC therapy  OT Frequency:     Barriers to D/C:            Co-evaluation              AM-PAC PT "6 Clicks" Daily Activity     Outcome Measure Help from another person eating meals?: None Help from another person taking care of personal grooming?: None Help from another person toileting, which includes using toliet, bedpan, or urinal?: A Little Help from another person bathing (including washing, rinsing, drying)?: A Little Help from another person to put on and taking off regular upper body clothing?: None Help from another person to put on and taking off regular lower body clothing?: A Little 6 Click Score: 21   End of Session    Activity Tolerance: Patient tolerated treatment  well Patient left: in chair;with call bell/phone within reach;with chair alarm set;with SCD's reapplied;Other (comment)(polar care in place)  OT Visit Diagnosis: Other abnormalities of gait and mobility (R26.89)                Time: 8850-2774 OT Time Calculation (min): 17 min Charges:  OT General Charges $OT Visit: 1 Visit OT Evaluation $OT Eval Low Complexity: 1 Low OT Treatments $Self Care/Home Management : 8-22 mins  Jeni Salles, MPH, MS, OTR/L ascom [phone] 06/11/18, 4:05 PM

## 2018-06-11 NOTE — Progress Notes (Signed)
Clinical Social Worker (CSW) received SNF consult. PT is recommending home health. RN case manager aware of above. Please reconsult if future social work needs arise. CSW signing off.   Renardo Cheatum, LCSW [phone] 

## 2018-06-11 NOTE — Progress Notes (Signed)
Physical Therapy Treatment Patient Details Name: Jerry Morgan MRN: [id number] DOB: December 30, 1960 Today's Date: 06/11/2018    History of Present Illness Pt underwent L TKR without reported post-op complications. PT evaluation performed POD#0. PMH includes DM, OA, GERD, and HTN    PT Comments    Slow speed, HOB elevated, use of bed rails for bed mobility. Increase effort this afternoon for bed mobility. Pt reports mild lightheadedness when sitting upright. Vitals obtained and VSS. Pt with slower speed this afternoon and decreased weight shift to LLE during transfers. Pt reporting increase in pain this afternoon and is only able to ambulate approximately 10'. Despite discouragement from therapist during AM session pt was eager to complete a lap around the RN station. He is also complaining of nausea in the afternoon so RN notified. Decreased weight acceptance to LLE during limited ambulation. Pt is able to perform seated exercises at EOB. Will progress ambulation distance tomorrow and attempt stairs as able. Pt will have a ramp to enter his home. Pt will benefit from PT services to address deficits in strength, balance, and mobility in order to return to full function at home.     Follow Up Recommendations  Home health PT     Equipment Recommendations  None recommended by PT;Other (comment)(Use rolling walker at discharge)    Recommendations for Other Services       Precautions / Restrictions Precautions Precautions: Knee Precaution Booklet Issued: Yes (comment) Restrictions Weight Bearing Restrictions: Yes LLE Weight Bearing: Weight bearing as tolerated    Mobility  Bed Mobility Overal bed mobility: Modified Independent Bed Mobility: Supine to Sit     Supine to sit: Modified independent (Device/Increase time)     General bed mobility comments: Slow speed, HOB elevated, use of bed rails. Increase effort this afternoon  Transfers Overall transfer level: Needs  assistance Equipment used: Rolling walker (2 wheeled) Transfers: Sit to/from Stand Sit to Stand: Min guard         General transfer comment: Pt with slower speed this afternoon and decreased weight shift to LLE.   Ambulation/Gait Ambulation/Gait assistance: Min guard Gait Distance (Feet): 10 Feet Assistive device: Rolling walker (2 wheeled)       General Gait Details: Pt reporting increase in pain this afternoon and is only able to ambulate approximately 10'. He is also complaining of nausea. Decreased weight acceptance to LLE during limited ambulation.    Stairs             Wheelchair Mobility    Modified Rankin (Stroke Patients Only)       Balance Overall balance assessment: Needs assistance Sitting-balance support: No upper extremity supported Sitting balance-Leahy Scale: Good     Standing balance support: Bilateral upper extremity supported Standing balance-Leahy Scale: Fair                              Cognition Arousal/Alertness: Awake/alert Behavior During Therapy: WFL for tasks assessed/performed Overall Cognitive Status: Within Functional Limits for tasks assessed                                        Exercises Total Joint Exercises Ankle Circles/Pumps: Both;10 reps;Other (comment)(Heel raises) Hip ABduction/ADduction: Both;10 reps Long Arc Quad: Left;10 reps Knee Flexion: Left;10 reps Marching in Standing: Both;10 reps;Seated    General Comments  Pertinent Vitals/Pain Pain Assessment: 0-10 Pain Score: 10-Worst pain ever Pain Location: L knee Pain Descriptors / Indicators: Operative site guarding Pain Intervention(s): Monitored during session    Home Living                      Prior Function            PT Goals (current goals can now be found in the care plan section) Acute Rehab PT Goals Patient Stated Goal: Return to prior level of function PT Goal Formulation: With  patient/family Time For Goal Achievement: 06/24/18 Potential to Achieve Goals: Good Progress towards PT goals: Progressing toward goals    Frequency    BID      PT Plan Current plan remains appropriate    Co-evaluation              AM-PAC PT "6 Clicks" Daily Activity  Outcome Measure  Difficulty turning over in bed (including adjusting bedclothes, sheets and blankets)?: None Difficulty moving from lying on back to sitting on the side of the bed? : A Little Difficulty sitting down on and standing up from a chair with arms (e.g., wheelchair, bedside commode, etc,.)?: A Little Help needed moving to and from a bed to chair (including a wheelchair)?: A Little Help needed walking in hospital room?: A Little Help needed climbing 3-5 steps with a railing? : A Lot 6 Click Score: 18    End of Session Equipment Utilized During Treatment: Gait belt Activity Tolerance: Patient tolerated treatment well Patient left: with call bell/phone within reach;with SCD's reapplied;Other (comment);in bed;with bed alarm set(towel roll under heel, polar care in place) Nurse Communication: Mobility status;Other (comment)(nausea) PT Visit Diagnosis: Unsteadiness on feet (R26.81);Muscle weakness (generalized) (M62.81);History of falling (Z91.81);Difficulty in walking, not elsewhere classified (R26.2);Pain Pain - Right/Left: Left Pain - part of body: Knee     Time: 1400-1415 PT Time Calculation (min) (ACUTE ONLY): 15 min  Charges:  $Therapeutic Exercise: 8-22 mins                    Lyndel Safe Huprich PT, DPT, GCS    Huprich,Jason 06/11/2018, 2:52 PM

## 2018-06-11 NOTE — Progress Notes (Signed)
Physical Therapy Treatment Patient Details Name: Jerry Morgan MRN: [id number] DOB: 1961-09-30 Today's Date: 06/11/2018    History of Present Illness Pt underwent L TKR without reported post-op complications. PT evaluation performed POD#0. PMH includes DM, OA, GERD, and HTN    PT Comments    Pt making excellent progress with therapy today. He is modified independent for all bed mobility. Pt demonstrates improved weight shift to LLE during transfers. Safe hand placement without cues. Stable in standing with UE support on rolling walker. Pt is able to ambulate a full lap around RN station. Therapist encouraged shorter distance however pt is highly motivated to complete the lap this morning. Pt is able to progress to step-through pattern with cues from therapist. Cues for upright posture and walker adjusted to improve safety and stability. Mild increase in pain with ambulation. No DOE reported and no signs of respiratory distress. AAROM progressing to -9 to 90 degrees and is limited by pain primarily. Pt will benefit from PT services to address deficits in strength, balance, and mobility in order to return to full function at home.    Follow Up Recommendations  Home health PT     Equipment Recommendations  None recommended by PT;Other (comment)(Use rolling walker at discharge)    Recommendations for Other Services       Precautions / Restrictions Precautions Precautions: Knee Precaution Booklet Issued: Yes (comment) Restrictions Weight Bearing Restrictions: Yes LLE Weight Bearing: Weight bearing as tolerated    Mobility  Bed Mobility Overal bed mobility: Modified Independent Bed Mobility: Supine to Sit     Supine to sit: Modified independent (Device/Increase time)     General bed mobility comments: Slow speed, HOB elevated, use of bed rails  Transfers Overall transfer level: Needs assistance Equipment used: Rolling walker (2 wheeled) Transfers: Sit to/from Stand Sit  to Stand: Min guard         General transfer comment: Pt demonstrates improved weight shift to LLE. Safe hand placement without cues. Stable in standing with UE support on rolling walker  Ambulation/Gait Ambulation/Gait assistance: Min guard Gait Distance (Feet): 200 Feet Assistive device: Rolling walker (2 wheeled)       General Gait Details: Pt is able to ambulate a full lap around RN station. Therapist encouraged shorter distance however pt is highly motivated to complete the lap this morning. Pt is able to progress to step-through pattern with cues from therapist. Cues for upright posture and walker adjusted to improve safety and stability. Mild increase in pain with ambulation. No DOE reported and no signs of respiratory distress   Stairs             Wheelchair Mobility    Modified Rankin (Stroke Patients Only)       Balance Overall balance assessment: Needs assistance Sitting-balance support: No upper extremity supported Sitting balance-Leahy Scale: Good     Standing balance support: Bilateral upper extremity supported Standing balance-Leahy Scale: Fair                              Cognition Arousal/Alertness: Awake/alert Behavior During Therapy: WFL for tasks assessed/performed Overall Cognitive Status: Within Functional Limits for tasks assessed                                        Exercises Total Joint Exercises Ankle Circles/Pumps: Both;10 reps Quad Sets:  Both;10 reps Gluteal Sets: Both;10 reps Towel Squeeze: Both;10 reps Short Arc Quad: Left;10 reps Heel Slides: Left;10 reps Hip ABduction/ADduction: Left;10 reps Straight Leg Raises: Left;10 reps Goniometric ROM: -9 to 90 degrees AAROM, pain limited    General Comments        Pertinent Vitals/Pain Pain Assessment: 0-10 Pain Score: 5  Pain Location: L knee Pain Descriptors / Indicators: Operative site guarding Pain Intervention(s): Monitored during  session;Premedicated before session    Home Living                      Prior Function            PT Goals (current goals can now be found in the care plan section) Acute Rehab PT Goals Patient Stated Goal: Return to prior level of function PT Goal Formulation: With patient/family Time For Goal Achievement: 06/24/18 Potential to Achieve Goals: Good Progress towards PT goals: Progressing toward goals    Frequency    BID      PT Plan Current plan remains appropriate    Co-evaluation              AM-PAC PT "6 Clicks" Daily Activity  Outcome Measure  Difficulty turning over in bed (including adjusting bedclothes, sheets and blankets)?: None Difficulty moving from lying on back to sitting on the side of the bed? : A Little Difficulty sitting down on and standing up from a chair with arms (e.g., wheelchair, bedside commode, etc,.)?: A Little Help needed moving to and from a bed to chair (including a wheelchair)?: A Little Help needed walking in hospital room?: A Little Help needed climbing 3-5 steps with a railing? : A Lot 6 Click Score: 18    End of Session Equipment Utilized During Treatment: Gait belt Activity Tolerance: Patient tolerated treatment well Patient left: in chair;with call bell/phone within reach;with chair alarm set;with SCD's reapplied;Other (comment)(towel roll under heel, polar care in place) Nurse Communication: Mobility status PT Visit Diagnosis: Unsteadiness on feet (R26.81);Muscle weakness (generalized) (M62.81);History of falling (Z91.81);Difficulty in walking, not elsewhere classified (R26.2);Pain Pain - Right/Left: Left Pain - part of body: Knee     Time: 6333-5456 PT Time Calculation (min) (ACUTE ONLY): 28 min  Charges:  $Gait Training: 8-22 mins $Therapeutic Exercise: 8-22 mins                     Tova Vater D Taren Toops PT, DPT, GCS    Roshawnda Pecora 06/11/2018, 10:24 AM

## 2018-06-12 LAB — GLUCOSE, CAPILLARY
GLUCOSE-CAPILLARY: 207 mg/dL — AB (ref 70–99)
Glucose-Capillary: 149 mg/dL — ABNORMAL HIGH (ref 70–99)

## 2018-06-12 MED ORDER — OXYCODONE HCL 5 MG PO TABS: 5.0000 mg | ORAL_TABLET | ORAL | 0 refills | Status: DC | PRN

## 2018-06-12 MED ORDER — LACTULOSE 10 GM/15ML PO SOLN: 10.0000 g | Freq: Two times a day (BID) | ORAL | Status: DC | PRN

## 2018-06-12 NOTE — Progress Notes (Signed)
PT Cancellation Note  Patient Details Name: Jerry Morgan MRN: [id number] DOB: October 14, 1960   Cancelled Treatment:    Reason Eval/Treat Not Completed: Patient declined, no reason specified.  Nursing in room and pt currently receiving pain medication.  Will check back in approximately 30 min.   Roxanne Gates, PT, DPT 06/12/2018, 8:35 AM

## 2018-06-12 NOTE — Progress Notes (Addendum)
Pt asymptomatic with second session of PT. Dr. Rudene Christians notified and cleared pt for d/c. Reviewed discharge instructions and prescriptions with pt and his wife. Pt requested robaxin prescription- Vance Peper PA notified, called into pt's pharmacy; he also clarified pt's aspirin-not in AVS, pt instructed to take 325 mg daily for two weeks per Vance Peper PA. PIV removed, VSS. Pt assisted to car via NT.  Ethelda Chick

## 2018-06-12 NOTE — Care Management Note (Signed)
Case Management Note  Patient Details  Name: Jerry Morgan MRN: [id number] Date of Birth: 06/30/1961  Subjective/Objective:  RNCM consult for home health needs. Met with patient at bedside to discuss discharge needs. Offered a list of home health providers. Referral to [facility] for HHPT. He has a walker. Will discharge with ASA. Wife will be caregiver. Patient will discharge by car.                   Action/Plan: [facility] for HHPT.   Expected Discharge Date:  06/12/18               Expected Discharge Plan:     In-House Referral:     Discharge planning Services  CM Consult  Post Acute Care Choice:  Home Health Choice offered to:  Patient, Spouse  DME Arranged:    DME Agency:     HH Arranged:  PT [place]:  [facility] (formerly [facility])  Status of Service:  Completed, signed off  If discussed at [employer], dates discussed:    Additional Comments:  Jolly Mango, RN 06/12/2018, 1:56 PM

## 2018-06-12 NOTE — Progress Notes (Signed)
PT reported that pt was dizzy and was orthostatic during session. After session, pt walked to bathroom with assistance and felt better- BP at that time was 127/77. Dr. Rudene Christians notified of orthostatic hypotension because pt has discharge orders. Dr. Rudene Christians would like for pt to work with PT this afternoon and see how he does. If he dose well, pt may discharge. Pt and his wife updated.   [place], Jerry Caras

## 2018-06-12 NOTE — Progress Notes (Signed)
Physical Therapy Treatment Patient Details Name: Jerry Morgan MRN: [id number] DOB: 13-Oct-1960 Today's Date: 06/12/2018    History of Present Illness Pt underwent L TKR without reported post-op complications. PT evaluation performed POD#0. OT evaluation performed POD#1. PMH includes DM, OA, GERD, and HTN    PT Comments    Patient demonstrated progress in ambulatory distance this afternoon.  He performed there ex for warm up prior to transfer and did not require cuing.  He was able to stand from recliner demonstrating more symmetry of stance, though still reporting high pain level.  Pt demonstrated WNL BP sitting, standing and following 60 ft of ambulation and was asymptomatic throughout.  He was able to perform a toilet transfer with VC's for use of grab bar and toilet riser arm rest.  Pt did still demonstrate gait deviations indicative of fall risk but was able to achieve a more reciprocal gait pattern than previously demonstrated.  He was able to demonstrate at least 90 degrees of knee flexion required for safe functional mobility. PT reviewed education regarding safe mobility in the home and ensured that Pt and his wife were comfortable with car transfers.  Pt will benefit from skilled PT with focus on pain management, tolerance to activity, strength and safe functional mobility.  Follow Up Recommendations  Home health PT     Equipment Recommendations  None recommended by PT;Other (comment)(Use rolling walker at discharge)    Recommendations for Other Services       Precautions / Restrictions Precautions Precautions: Knee Restrictions Weight Bearing Restrictions: Yes LLE Weight Bearing: Weight bearing as tolerated    Mobility  Bed Mobility Overal bed mobility: (Pt in recliner.) Bed Mobility: Supine to Sit     Supine to sit: Modified independent (Device/Increase time)     General bed mobility comments: Slow speed, HOB elevated, use of bed rails. Manual assistance to  bring L LE over EOB.  Transfers Overall transfer level: Needs assistance Equipment used: Rolling walker (2 wheeled) Transfers: Sit to/from Stand Sit to Stand: Min guard         General transfer comment: Able to stand from bedside without assistance to rise.  Able to stand with more symmetry this afternoon.  Able to stand from recliner and toilet using grab bar and arm of toilet riser.  Ambulation/Gait Ambulation/Gait assistance: Min guard Gait Distance (Feet): 60 Feet Assistive device: Rolling walker (2 wheeled)     Gait velocity interpretation: <1.8 ft/sec, indicate of risk for recurrent falls General Gait Details: Slow antalgic gait, able to navigate tight spaces, step to gait pattern leading with L LE.   Stairs             Wheelchair Mobility    Modified Rankin (Stroke Patients Only)       Balance Overall balance assessment: Needs assistance Sitting-balance support: No upper extremity supported Sitting balance-Leahy Scale: Good     Standing balance support: Bilateral upper extremity supported Standing balance-Leahy Scale: Fair                              Cognition Arousal/Alertness: Awake/alert Behavior During Therapy: WFL for tasks assessed/performed Overall Cognitive Status: Within Functional Limits for tasks assessed                                        Exercises Total Joint Exercises Ankle Circles/Pumps:  AROM;10 reps;Seated;Both Quad Sets: Strengthening;Left;10 reps;Seated Short Arc Quad: AROM;Left;10 reps;Seated Knee Flexion: AROM;Left;5 reps;Seated Goniometric ROM: Knee ext/flexion ROM L: 9-91 degrees Other Exercises Other Exercises: Educated pt's wife concerning assistance with mobility at home and making sure that pt stands up slowly and is not dizzy prior to ambulation.  x4 min    General Comments        Pertinent Vitals/Pain Pain Assessment: Faces Faces Pain Scale: Hurts little more Pain Location: L  knee during ambulation Pain Intervention(s): Monitored during session    Home Living                      Prior Function            PT Goals (current goals can now be found in the care plan section) Acute Rehab PT Goals Patient Stated Goal: Return to prior level of function Time For Goal Achievement: 06/26/18 Potential to Achieve Goals: Good Progress towards PT goals: Progressing toward goals    Frequency    BID      PT Plan Current plan remains appropriate    Co-evaluation              AM-PAC PT "6 Clicks" Daily Activity  Outcome Measure  Difficulty turning over in bed (including adjusting bedclothes, sheets and blankets)?: None Difficulty moving from lying on back to sitting on the side of the bed? : A Little Difficulty sitting down on and standing up from a chair with arms (e.g., wheelchair, bedside commode, etc,.)?: A Little Help needed moving to and from a bed to chair (including a wheelchair)?: A Little Help needed walking in hospital room?: A Little Help needed climbing 3-5 steps with a railing? : A Lot 6 Click Score: 18    End of Session Equipment Utilized During Treatment: Gait belt Activity Tolerance: Patient limited by pain;Patient tolerated treatment well Patient left: in chair;with chair alarm set;Other (comment)(pillow under L heel, polar care in place) Nurse Communication: Mobility status;Other (comment) PT Visit Diagnosis: Unsteadiness on feet (R26.81);Muscle weakness (generalized) (M62.81);History of falling (Z91.81);Difficulty in walking, not elsewhere classified (R26.2);Pain Pain - Right/Left: Left Pain - part of body: Knee     Time: 8675-4492 PT Time Calculation (min) (ACUTE ONLY): 18 min  Charges:  $Therapeutic Exercise: 8-22 mins $Therapeutic Activity: 8-22 mins                     Roxanne Gates, PT, DPT    Roxanne Gates 06/12/2018, 1:23 PM

## 2018-06-12 NOTE — Progress Notes (Signed)
Physical Therapy Treatment Patient Details Name: Jerry Morgan MRN: [id number] DOB: Jan 01, 1961 Today's Date: 06/12/2018    History of Present Illness Pt underwent L TKR without reported post-op complications. PT evaluation performed POD#0. OT evaluation performed POD#1. PMH includes DM, OA, GERD, and HTN    PT Comments    Patient progressed ambulatory distance today using RW (20 ft) but was still limited by nausea and dizziness.  Pt able to perform bed mobility with min A to move L LE over EOB, able to sit at bedside with good balance and perform there ex independently.  He is slow to stand but able to manage WB of L LE using RW.  Pt was required to return and sit on bed and PT provided emesis tray following ambulation.  He was able to transfer to recliner to sit and measured hypotensive when standing. RN was notified of this.  Pt demonstrated some unsteadiness on feet when backing to chair but was aware and able to correct.  Education provided by PT and pt and wife are comfortable with management of polar care and home setup.  Patient will continue to benefit from skilled PT with focus on safe mobility and fall prevention, pain management and HEP.  Follow Up Recommendations  Home health PT     Equipment Recommendations  None recommended by PT;Other (comment)(Use rolling walker at discharge)    Recommendations for Other Services       Precautions / Restrictions Precautions Precautions: Knee Precaution Booklet Issued: Yes (comment) Restrictions Weight Bearing Restrictions: Yes LLE Weight Bearing: Weight bearing as tolerated    Mobility  Bed Mobility Overal bed mobility: Needs Assistance Bed Mobility: Supine to Sit     Supine to sit: Modified independent (Device/Increase time)     General bed mobility comments: Slow speed, HOB elevated, use of bed rails. Manual assistance to bring L LE over EOB.  Transfers Overall transfer level: Needs assistance Equipment used:  Rolling walker (2 wheeled) Transfers: Sit to/from Stand Sit to Stand: Min guard         General transfer comment: Able to stand from bedside without assistance to rise.  Slow to stand and wt shift to L side.  PT provided education regarding WB status and gradual wt shift.  Ambulation/Gait Ambulation/Gait assistance: Min guard Gait Distance (Feet): 20 Feet Assistive device: Rolling walker (2 wheeled)     Gait velocity interpretation: <1.8 ft/sec, indicate of risk for recurrent falls General Gait Details: Pt able to ambulate 20 ft with proper use of RW.  Became nauseated and dizzy and asked to return to sit down.  Slow antalgic gait, some difficulty in backing to chair.  Pt hypotensive in standing.  RN notified.   Stairs             Wheelchair Mobility    Modified Rankin (Stroke Patients Only)       Balance Overall balance assessment: Needs assistance Sitting-balance support: No upper extremity supported Sitting balance-Leahy Scale: Good     Standing balance support: Bilateral upper extremity supported Standing balance-Leahy Scale: Fair                              Cognition Arousal/Alertness: Awake/alert Behavior During Therapy: WFL for tasks assessed/performed Overall Cognitive Status: Within Functional Limits for tasks assessed  Exercises Total Joint Exercises Ankle Circles/Pumps: AROM;10 reps;Seated;Both Short Arc Quad: AROM;Left;10 reps;Seated Other Exercises Other Exercises: Provided education concerning home setup and what to expect during home health PT.  Pt and wife comfortable with management of polar care.  x6 min.    General Comments        Pertinent Vitals/Pain Pain Assessment: Faces Faces Pain Scale: Hurts even more Pain Location: Pain slightly decreased compared to this morning but still increasing significantly when pt stands. Pain Intervention(s): Limited activity within  patient's tolerance;Monitored during session;Premedicated before session    Home Living                      Prior Function            PT Goals (current goals can now be found in the care plan section) Acute Rehab PT Goals Patient Stated Goal: Return to prior level of function Time For Goal Achievement: 06/26/18 Progress towards PT goals: Progressing toward goals    Frequency    BID      PT Plan Current plan remains appropriate    Co-evaluation              AM-PAC PT "6 Clicks" Daily Activity  Outcome Measure  Difficulty turning over in bed (including adjusting bedclothes, sheets and blankets)?: None Difficulty moving from lying on back to sitting on the side of the bed? : A Little Difficulty sitting down on and standing up from a chair with arms (e.g., wheelchair, bedside commode, etc,.)?: A Little Help needed moving to and from a bed to chair (including a wheelchair)?: A Little Help needed walking in hospital room?: A Little Help needed climbing 3-5 steps with a railing? : A Lot 6 Click Score: 18    End of Session Equipment Utilized During Treatment: Gait belt Activity Tolerance: Patient limited by pain Patient left: in chair;with chair alarm set;Other (comment)(pillow under heel, polar care in place) Nurse Communication: Mobility status;Other (comment)(nausea) PT Visit Diagnosis: Unsteadiness on feet (R26.81);Muscle weakness (generalized) (M62.81);History of falling (Z91.81);Difficulty in walking, not elsewhere classified (R26.2);Pain Pain - Right/Left: Left Pain - part of body: Knee     Time: 5631-4970 PT Time Calculation (min) (ACUTE ONLY): 26 min  Charges:  $Therapeutic Exercise: 8-22 mins $Therapeutic Activity: 8-22 mins                     Roxanne Gates, PT, DPT    Roxanne Gates 06/12/2018, 10:03 AM

## 2018-06-12 NOTE — Progress Notes (Signed)
   Subjective: 2 Days Post-Op Procedure(s) (LRB): TOTAL KNEE ARTHROPLASTY (Left) Patient reports pain as moderate.   Patient is well, and has had no acute complaints or problems Patient did well with therapy yesterday.  Was able to ambulate all around the nurses desk.  Range of motion 0 to 90 degrees.  Still needs to do stairs prior to discharge. Plan is to go Home after hospital stay. no nausea and no vomiting Patient denies any chest pains or shortness of breath. Objective: Vital signs in last 24 hours: Temp:  [97.6 F (36.4 C)-98.6 F (37 C)] 98.3 F (36.8 C) (09/11 2323) Pulse Rate:  [75-81] 79 (09/11 2323) Resp:  [20] 20 (09/11 2323) BP: (143-171)/(92-102) 143/92 (09/11 2323) SpO2:  [98 %-99 %] 98 % (09/11 2323) well approximated incision Heels are non tender and elevated off the bed using rolled towels Intake/Output from previous day: 09/11 0701 - 09/12 0700 In: 480 [P.O.:480] Out: 2060 [Urine:2060] Intake/Output this shift: No intake/output data recorded.  Recent Labs    06/11/18 0521  HGB 10.9*   Recent Labs    06/11/18 0521  WBC 5.2  RBC 3.30*  HCT 31.5*  PLT 148*   Recent Labs    06/11/18 0521  NA 133*  K 3.8  CL 102  CO2 23  BUN 14  CREATININE 0.57*  GLUCOSE 194*  CALCIUM 8.3*   No results for input(s): LABPT, INR in the last 72 hours.  EXAM General - Patient is Alert, Appropriate and Oriented Extremity - Neurologically intact Neurovascular intact Sensation intact distally Intact pulses distally Dorsiflexion/Plantar flexion intact No cellulitis present Compartment soft Dressing - scant drainage Motor Function - intact, moving foot and toes well on exam.    Past Medical History:  Diagnosis Date  . Arthritis   . GERD (gastroesophageal reflux disease)   . Hypertension   . Rib fracture 09/12/2016   after being jerked against a door frame by his son's dog.  Pt sustained Lt rib fractures 5-10 with flail chest segment 5-7/notes 09/13/2016   . Type II diabetes mellitus ([place])    dx'd ~ 08/2016    Assessment/Plan: 2 Days Post-Op Procedure(s) (LRB): TOTAL KNEE ARTHROPLASTY (Left) Active Problems:   Status post total knee replacement using cement, left  Estimated body mass index is 30.15 kg/m as calculated from the following:   Height as of this encounter: 6' (1.829 m).   Weight as of this encounter: 100.8 kg. Up with therapy Discharge home with home health after patient does stairs and has a bowel movement  Labs: None DVT Prophylaxis - Aspirin, TED hose and SCDs Weight-Bearing as tolerated to left leg Dressing change today. Please give the patient 2 extra honeycomb dressings to take home Be sure TED stockings on both legs prior to discharge  Kristyn Obyrne R. [place] 06/12/2018, 7:51 AM

## 2018-06-13 DIAGNOSIS — Z471 Aftercare following joint replacement surgery: Secondary | ICD-10-CM | POA: Diagnosis not present

## 2018-06-13 DIAGNOSIS — M170 Bilateral primary osteoarthritis of knee: Secondary | ICD-10-CM | POA: Diagnosis not present

## 2018-06-17 DIAGNOSIS — Z471 Aftercare following joint replacement surgery: Secondary | ICD-10-CM | POA: Diagnosis not present

## 2018-06-17 DIAGNOSIS — M170 Bilateral primary osteoarthritis of knee: Secondary | ICD-10-CM | POA: Diagnosis not present

## 2018-06-18 ENCOUNTER — Emergency Department: Payer: 59

## 2018-06-18 ENCOUNTER — Emergency Department
Admission: EM | Admit: 2018-06-18 | Discharge: 2018-06-18 | Disposition: A | Discharge status: Home / Self Care | Payer: 59 | Attending: Emergency Medicine | Admitting: Emergency Medicine

## 2018-06-18 ENCOUNTER — Other Ambulatory Visit: Payer: Self-pay

## 2018-06-18 DIAGNOSIS — E871 Hypo-osmolality and hyponatremia: Secondary | ICD-10-CM | POA: Diagnosis not present

## 2018-06-18 DIAGNOSIS — E119 Type 2 diabetes mellitus without complications: Secondary | ICD-10-CM | POA: Diagnosis not present

## 2018-06-18 DIAGNOSIS — Z96652 Presence of left artificial knee joint: Secondary | ICD-10-CM | POA: Insufficient documentation

## 2018-06-18 DIAGNOSIS — R42 Dizziness and giddiness: Secondary | ICD-10-CM | POA: Diagnosis present

## 2018-06-18 DIAGNOSIS — Z7982 Long term (current) use of aspirin: Secondary | ICD-10-CM | POA: Insufficient documentation

## 2018-06-18 DIAGNOSIS — I951 Orthostatic hypotension: Secondary | ICD-10-CM | POA: Diagnosis not present

## 2018-06-18 DIAGNOSIS — Z7984 Long term (current) use of oral hypoglycemic drugs: Secondary | ICD-10-CM | POA: Diagnosis not present

## 2018-06-18 DIAGNOSIS — I10 Essential (primary) hypertension: Secondary | ICD-10-CM | POA: Insufficient documentation

## 2018-06-18 DIAGNOSIS — I2510 Atherosclerotic heart disease of native coronary artery without angina pectoris: Secondary | ICD-10-CM | POA: Diagnosis not present

## 2018-06-18 LAB — BASIC METABOLIC PANEL
ANION GAP: 12 (ref 5–15)
BUN: 22 mg/dL — ABNORMAL HIGH (ref 6–20)
CALCIUM: 9.7 mg/dL (ref 8.9–10.3)
CO2: 27 mmol/L (ref 22–32)
Chloride: 91 mmol/L — ABNORMAL LOW (ref 98–111)
Creatinine, Ser: 0.71 mg/dL (ref 0.61–1.24)
Glucose, Bld: 138 mg/dL — ABNORMAL HIGH (ref 70–99)
Potassium: 4.6 mmol/L (ref 3.5–5.1)
SODIUM: 130 mmol/L — AB (ref 135–145)

## 2018-06-18 LAB — CBC
HCT: 31 % — ABNORMAL LOW (ref 40.0–52.0)
Hemoglobin: 10.8 g/dL — ABNORMAL LOW (ref 13.0–18.0)
MCH: 32.3 pg (ref 26.0–34.0)
MCHC: 34.7 g/dL (ref 32.0–36.0)
MCV: 93 fL (ref 80.0–100.0)
PLATELETS: 396 10*3/uL (ref 150–440)
RBC: 3.34 MIL/uL — ABNORMAL LOW (ref 4.40–5.90)
RDW: 14.2 % (ref 11.5–14.5)
WBC: 6.9 10*3/uL (ref 3.8–10.6)

## 2018-06-18 LAB — URINALYSIS, COMPLETE (UACMP) WITH MICROSCOPIC
Bacteria, UA: NONE SEEN
Bilirubin Urine: NEGATIVE
Glucose, UA: >=500 mg/dL — AB
HGB URINE DIPSTICK: NEGATIVE
Ketones, ur: NEGATIVE mg/dL
Leukocytes, UA: NEGATIVE
Nitrite: NEGATIVE
PH: 6 (ref 5.0–8.0)
Protein, ur: NEGATIVE mg/dL
SPECIFIC GRAVITY, URINE: 1.028 (ref 1.005–1.030)
Squamous Epithelial / LPF: NONE SEEN (ref 0–5)

## 2018-06-18 LAB — TROPONIN I: Troponin I: <0.03 ng/mL (ref <0.03)

## 2018-06-18 MED ORDER — SODIUM CHLORIDE 0.9 % IV BOLUS
1000.0000 mL | Freq: Once | INTRAVENOUS | Status: AC
  Administered 2018-06-18: 1000 mL via INTRAVENOUS

## 2018-06-18 MED ORDER — IOHEXOL 350 MG/ML SOLN
75.0000 mL | Freq: Once | INTRAVENOUS | Status: AC | PRN
  Administered 2018-06-18: 75 mL via INTRAVENOUS

## 2018-06-18 MED ORDER — IOPAMIDOL (ISOVUE-370) INJECTION 76%: 75.0000 mL | Freq: Once | INTRAVENOUS | Status: DC | PRN

## 2018-06-18 MED ORDER — OXYCODONE HCL 5 MG PO TABS
ORAL_TABLET | ORAL | Status: AC
  Administered 2018-06-18: 5 mg via ORAL
  Filled 2018-06-18: qty 1

## 2018-06-18 MED ORDER — ONDANSETRON HCL 4 MG/2ML IJ SOLN: 4.0000 mg | Freq: Once | INTRAMUSCULAR | Status: DC

## 2018-06-18 MED ORDER — OXYCODONE HCL 5 MG PO TABS
5.0000 mg | ORAL_TABLET | Freq: Once | ORAL | Status: AC
  Administered 2018-06-18: 5 mg via ORAL

## 2018-06-18 NOTE — Discharge Instructions (Signed)
These drink plenty of fluid to stay well-hydrated; this should help your sodium level as well as your lightheaded feeling when you stand.  Return to the emergency department if you develop severe pain, lightheadedness or fainting, fever, any increased swelling, pus drainage or redness around your knee, or for any other symptoms concerning to you.

## 2018-06-18 NOTE — ED Notes (Signed)
Patient transported to CT 

## 2018-06-18 NOTE — ED Provider Notes (Signed)
[facility] Emergency Department Provider Note  ____________________________________________  Time seen: Approximately 1:17 PM  I have reviewed the triage vital signs and the nursing notes.   HISTORY  Chief Complaint Dizziness    HPI Jerry Morgan is a 57 y.o. male that is post left knee arthroplasty 06/10/2018 presenting with postural lightheadedness and nausea.  The patient reports that he has had a reassuring postoperative course with increasing mobility of the left knee and no pain.  However, despite drinking plenty of fluid, he feels lightheaded when he stands with some associated nausea, which makes it difficult for him to do his rehab.  He has not had any fevers or chills, chest pain, nausea vomiting or diarrhea, fever or chills.  Past Medical History:  Diagnosis Date  . Arthritis   . GERD (gastroesophageal reflux disease)   . Hypertension   . Rib fracture 09/12/2016   after being jerked against a door frame by his son's dog.  Pt sustained Lt rib fractures 5-10 with flail chest segment 5-7/notes 09/13/2016  . Type II diabetes mellitus ([place])    dx'd ~ 08/2016    Patient Active Problem List   Diagnosis Date Noted  . Status post total knee replacement using cement, left 06/10/2018  . Avascular necrosis of femoral head, left ([place]) 04/28/2018  . Vaccine counseling 12/12/2017  . Other male erectile dysfunction 07/30/2017  . Hyperlipidemia due to type 2 diabetes mellitus ([place]) 03/28/2017  . Class 1 obesity due to excess calories with serious comorbidity and body mass index (BMI) of 33.0 to 33.9 in adult 03/26/2017  . Heart murmur, systolic 86/57/8469  . Type 2 diabetes mellitus without complication, without long-term current use of insulin ([place]) 03/26/2017  . Blunt chest trauma 09/14/2016  . HTN (hypertension) 09/14/2016  . DM (diabetes mellitus) ([place]) 09/14/2016  . Lymphadenopathy 09/14/2016  . Urinary retention 09/14/2016  . Multiple closed  fractures of ribs of left side 09/13/2016    Past Surgical History:  Procedure Laterality Date  . COLONOSCOPY WITH PROPOFOL N/A 02/09/2016   Procedure: COLONOSCOPY WITH PROPOFOL;  Surgeon: Hulen Luster, MD;  Location: [facility] ENDOSCOPY;  Service: Gastroenterology;  Laterality: N/A;  . ESOPHAGOGASTRODUODENOSCOPY (EGD) WITH PROPOFOL N/A 02/09/2016   Procedure: ESOPHAGOGASTRODUODENOSCOPY (EGD) WITH PROPOFOL;  Surgeon: Hulen Luster, MD;  Location: [facility] ENDOSCOPY;  Service: Gastroenterology;  Laterality: N/A;  . JOINT REPLACEMENT  2000s   "joints in big toes"   . KNEE ARTHROSCOPY Left ~ 2010  . toe implant    . TONSILLECTOMY    . TOTAL KNEE ARTHROPLASTY Left 06/10/2018   Procedure: TOTAL KNEE ARTHROPLASTY;  Surgeon: Hessie Knows, MD;  Location: [facility] ORS;  Service: Orthopedics;  Laterality: Left;    Current Outpatient Rx  . Order #: [id number] Class: Historical Med  . Order #: [id number] Class: Historical Med  . Order #: [id number] Class: Historical Med  . Order #: [id number] Class: Historical Med  . Order #: [id number] Class: Historical Med  . Order #: [id number] Class: Normal  . Order #: [id number] Class: Print  . Order #: [id number] Class: Historical Med  . Order #: [id number] Class: Historical Med  . Order #: [id number] Class: Normal  . Order #: [id number] Class: Normal  . Order #: [id number] Class: Historical Med  . Order #: [id number] Class: Print    Allergies Penicillins  Family History  Problem Relation Age of Onset  . Hypertension Mother   . Heart disease Father   . Hypertension Father     Social History Social History   Tobacco Use  . Smoking status:  Never Smoker  . Smokeless tobacco: Never Used  Substance Use Topics  . Alcohol use: Yes    Alcohol/week: 14.0 standard drinks    Types: 14 Shots of liquor per week    Comment: 2 -3 shots liquor per day  . Drug use: No    Review of Systems Constitutional: No fever/chills.  Positive lightheadedness without syncope peer Eyes: No visual changes.   No blurred or double vision. ENT: No sore throat. No congestion or rhinorrhea. Cardiovascular: Denies chest pain. Denies palpitations. Respiratory: Denies shortness of breath.  No cough. Gastrointestinal: No abdominal pain.  Positive nausea, no vomiting.  No diarrhea.  No constipation. Genitourinary: Negative for dysuria. Musculoskeletal: Negative for back pain.  Status post left knee arthroplasty without significant swelling or pain peer Skin: Negative for rash.  No erythema, warmth or purulent discharge at his surgical site peer Neurological: Negative for headaches. No focal numbness, tingling or weakness.     ____________________________________________   PHYSICAL EXAM:  VITAL SIGNS: ED Triage Vitals  Enc Vitals Group     BP 06/18/18 1246 103/83     Pulse Rate 06/18/18 1246 72     Resp 06/18/18 1246 18     Temp 06/18/18 1246 97.7 F (36.5 C)     Temp Source 06/18/18 1246 Oral     SpO2 06/18/18 1243 98 %     Weight 06/18/18 1247 222 lb 4.8 oz (100.8 kg)     Height 06/18/18 1247 6' (1.829 m)     Head Circumference --      Peak Flow --      Pain Score 06/18/18 1247 5     Pain Loc --      Pain Edu? --      Excl. in [place]? --     Constitutional: Alert and oriented.  Answers questions appropriately. Eyes: Conjunctivae are normal.  EOMI. No scleral icterus. Head: Atraumatic. Nose: No congestion/rhinnorhea. Mouth/Throat: Mucous membranes are moist.  Neck: No stridor.  Supple.  No JVD.  No meningismus. Cardiovascular: Normal rate, regular rhythm. No murmurs, rubs or gallops.  Respiratory: Normal respiratory effort.  No accessory muscle use or retractions. Lungs CTAB.  No wheezes, rales or ronchi. Gastrointestinal: Obese. Soft, nontender and nondistended.  No guarding or rebound.  No peritoneal signs. Musculoskeletal: The patient has a 10 inch surgical incision over the anterior aspect of the knee without any erythema, warmth or purulent discharge.  He does have left lower  extremity swelling from the mid tibial shaft to the mid thigh.  Normal DP and PT pulses with normal sensation to light touch in the left lower extremity.  Cap refill is less than 2 seconds.  The patient has fair range of motion of the left knee without any pain.  No ttp in the calves or palpable cords.  Negative Homan's sign. Neurologic:  A&Ox3.  Speech is clear.  Face and smile are symmetric.  EOMI.  Moves all extremities well. Skin:  Skin is warm, dry and intact. No rash noted. Psychiatric: Mood and affect are normal. Speech and behavior are normal.  Normal judgement.  ____________________________________________   LABS (all labs ordered are listed, but only abnormal results are displayed)  Labs Reviewed  CBC - Abnormal; Notable for the following components:      Result Value   RBC 3.34 (*)    Hemoglobin 10.8 (*)    HCT 31.0 (*)    All other components within normal limits  BASIC METABOLIC PANEL  TROPONIN I  URINALYSIS, COMPLETE (UACMP) WITH MICROSCOPIC   ____________________________________________  EKG  ED ECG REPORT I, Anne-Caroline Mariea Clonts, the attending physician, personally viewed and interpreted this ECG.   Date: 06/18/2018  EKG Time: 1358  Rate: 76  Rhythm: normal sinus rhythm  Axis: normal  Intervals:none  ST&T Change: No STEMI  ____________________________________________  RADIOLOGY  No results found.  ____________________________________________   PROCEDURES  Procedure(s) performed: None  Procedures  Critical Care performed: No ____________________________________________   INITIAL IMPRESSION / ASSESSMENT AND PLAN / ED COURSE  Pertinent labs & imaging results that were available during my care of the patient were reviewed by me and considered in my medical decision making (see chart for details).  57 y.o. male with recent left knee arthroplasty presenting for postural lightheadedness.  Overall, the patient is hemodynamically stable and appears  well-hydrated but we will get orthostatics for evaluation of hypovolemia.  The patient's knee has a reassuring examination and I do not suspect septic arthritis; I do not see any evidence of infection today.  We will get a UA to rule out UTI given his recent surgery.  The patient will be given intravenous fluids and Zofran for symptomatic treatment.  Plan reevaluation for final disposition  ____________________________________________  FINAL CLINICAL IMPRESSION(S) / ED DIAGNOSES  Final diagnoses:  None         NEW MEDICATIONS STARTED DURING THIS VISIT:  New Prescriptions   No medications on file      Eula Listen, MD 06/18/18 1523

## 2018-06-18 NOTE — ED Provider Notes (Signed)
-----------------------------------------   5:34 PM on 06/18/2018 -----------------------------------------  I took over care of this patient from Dr. Mariea Clonts.  Patient was pending CT chest to rule out PE, and it is negative.  After 2 L of NS when the patient sits or stands up he is no longer lightheaded.  His blood pressure still goes down slightly when he stands up, but he is asymptomatic at this time and would like to go home.  Return precautions given, and he expresses understanding.   Arta Silence, MD 06/18/18 1735

## 2018-06-18 NOTE — ED Notes (Signed)
Pt could not stand for OV he became extremely dizzy.

## 2018-06-18 NOTE — ED Triage Notes (Signed)
Pt arrived via ems from home with dizziness that started this past Sat. Pt had total left knee replacement and was discharged on Thursday. Since arrriving home, pt has not been eating and has been nauseous and dizzy. Vitals WNL, pt has hx of DM and HTN. Pt NAD, respirations even and non labored.

## 2018-06-19 DIAGNOSIS — M170 Bilateral primary osteoarthritis of knee: Secondary | ICD-10-CM | POA: Diagnosis not present

## 2018-06-19 DIAGNOSIS — Z471 Aftercare following joint replacement surgery: Secondary | ICD-10-CM | POA: Diagnosis not present

## 2018-06-20 DIAGNOSIS — M170 Bilateral primary osteoarthritis of knee: Secondary | ICD-10-CM | POA: Diagnosis not present

## 2018-06-20 DIAGNOSIS — Z471 Aftercare following joint replacement surgery: Secondary | ICD-10-CM | POA: Diagnosis not present

## 2018-06-23 DIAGNOSIS — Z471 Aftercare following joint replacement surgery: Secondary | ICD-10-CM | POA: Diagnosis not present

## 2018-06-23 DIAGNOSIS — M170 Bilateral primary osteoarthritis of knee: Secondary | ICD-10-CM | POA: Diagnosis not present

## 2018-06-24 DIAGNOSIS — Z471 Aftercare following joint replacement surgery: Secondary | ICD-10-CM | POA: Diagnosis not present

## 2018-06-24 DIAGNOSIS — M170 Bilateral primary osteoarthritis of knee: Secondary | ICD-10-CM | POA: Diagnosis not present

## 2018-06-25 DIAGNOSIS — M25562 Pain in left knee: Secondary | ICD-10-CM | POA: Diagnosis not present

## 2018-06-25 DIAGNOSIS — M6281 Muscle weakness (generalized): Secondary | ICD-10-CM | POA: Diagnosis not present

## 2018-06-25 DIAGNOSIS — Z96652 Presence of left artificial knee joint: Secondary | ICD-10-CM | POA: Diagnosis not present

## 2018-06-27 DIAGNOSIS — M6281 Muscle weakness (generalized): Secondary | ICD-10-CM | POA: Diagnosis not present

## 2018-06-27 DIAGNOSIS — M25562 Pain in left knee: Secondary | ICD-10-CM | POA: Diagnosis not present

## 2018-06-27 DIAGNOSIS — Z96652 Presence of left artificial knee joint: Secondary | ICD-10-CM | POA: Diagnosis not present

## 2018-06-30 DIAGNOSIS — M6281 Muscle weakness (generalized): Secondary | ICD-10-CM | POA: Diagnosis not present

## 2018-06-30 DIAGNOSIS — Z96652 Presence of left artificial knee joint: Secondary | ICD-10-CM | POA: Diagnosis not present

## 2018-06-30 DIAGNOSIS — M25562 Pain in left knee: Secondary | ICD-10-CM | POA: Diagnosis not present

## 2018-07-03 DIAGNOSIS — M25562 Pain in left knee: Secondary | ICD-10-CM | POA: Diagnosis not present

## 2018-07-03 DIAGNOSIS — M25662 Stiffness of left knee, not elsewhere classified: Secondary | ICD-10-CM | POA: Diagnosis not present

## 2018-07-03 DIAGNOSIS — Z96652 Presence of left artificial knee joint: Secondary | ICD-10-CM | POA: Diagnosis not present

## 2018-07-07 DIAGNOSIS — M6281 Muscle weakness (generalized): Secondary | ICD-10-CM | POA: Diagnosis not present

## 2018-07-07 DIAGNOSIS — M25562 Pain in left knee: Secondary | ICD-10-CM | POA: Diagnosis not present

## 2018-07-07 DIAGNOSIS — Z96652 Presence of left artificial knee joint: Secondary | ICD-10-CM | POA: Diagnosis not present

## 2018-07-10 DIAGNOSIS — M25562 Pain in left knee: Secondary | ICD-10-CM | POA: Diagnosis not present

## 2018-07-10 DIAGNOSIS — M6281 Muscle weakness (generalized): Secondary | ICD-10-CM | POA: Diagnosis not present

## 2018-07-10 DIAGNOSIS — Z96652 Presence of left artificial knee joint: Secondary | ICD-10-CM | POA: Diagnosis not present

## 2018-07-14 DIAGNOSIS — M25562 Pain in left knee: Secondary | ICD-10-CM | POA: Diagnosis not present

## 2018-07-14 DIAGNOSIS — Z96652 Presence of left artificial knee joint: Secondary | ICD-10-CM | POA: Diagnosis not present

## 2018-07-14 DIAGNOSIS — M6281 Muscle weakness (generalized): Secondary | ICD-10-CM | POA: Diagnosis not present

## 2018-07-21 DIAGNOSIS — Z96652 Presence of left artificial knee joint: Secondary | ICD-10-CM | POA: Diagnosis not present

## 2018-07-21 DIAGNOSIS — M25562 Pain in left knee: Secondary | ICD-10-CM | POA: Diagnosis not present

## 2018-07-21 DIAGNOSIS — M6281 Muscle weakness (generalized): Secondary | ICD-10-CM | POA: Diagnosis not present

## 2018-07-24 DIAGNOSIS — Z96652 Presence of left artificial knee joint: Secondary | ICD-10-CM | POA: Diagnosis not present

## 2018-07-24 DIAGNOSIS — M6281 Muscle weakness (generalized): Secondary | ICD-10-CM | POA: Diagnosis not present

## 2018-07-24 DIAGNOSIS — M25562 Pain in left knee: Secondary | ICD-10-CM | POA: Diagnosis not present

## 2018-07-25 DIAGNOSIS — M1712 Unilateral primary osteoarthritis, left knee: Secondary | ICD-10-CM | POA: Diagnosis not present

## 2018-07-28 DIAGNOSIS — M25562 Pain in left knee: Secondary | ICD-10-CM | POA: Diagnosis not present

## 2018-07-28 DIAGNOSIS — Z96652 Presence of left artificial knee joint: Secondary | ICD-10-CM | POA: Diagnosis not present

## 2018-07-28 DIAGNOSIS — E785 Hyperlipidemia, unspecified: Secondary | ICD-10-CM | POA: Diagnosis not present

## 2018-07-28 DIAGNOSIS — I10 Essential (primary) hypertension: Secondary | ICD-10-CM | POA: Diagnosis not present

## 2018-07-28 DIAGNOSIS — E1169 Type 2 diabetes mellitus with other specified complication: Secondary | ICD-10-CM | POA: Diagnosis not present

## 2018-07-28 DIAGNOSIS — M6281 Muscle weakness (generalized): Secondary | ICD-10-CM | POA: Diagnosis not present

## 2018-07-28 DIAGNOSIS — E1159 Type 2 diabetes mellitus with other circulatory complications: Secondary | ICD-10-CM | POA: Diagnosis not present

## 2018-08-01 DIAGNOSIS — M6281 Muscle weakness (generalized): Secondary | ICD-10-CM | POA: Diagnosis not present

## 2018-08-01 DIAGNOSIS — M25562 Pain in left knee: Secondary | ICD-10-CM | POA: Diagnosis not present

## 2018-08-01 DIAGNOSIS — Z96652 Presence of left artificial knee joint: Secondary | ICD-10-CM | POA: Diagnosis not present

## 2018-08-04 DIAGNOSIS — M25662 Stiffness of left knee, not elsewhere classified: Secondary | ICD-10-CM | POA: Diagnosis not present

## 2018-08-04 DIAGNOSIS — E6609 Other obesity due to excess calories: Secondary | ICD-10-CM | POA: Diagnosis not present

## 2018-08-04 DIAGNOSIS — E1169 Type 2 diabetes mellitus with other specified complication: Secondary | ICD-10-CM | POA: Diagnosis not present

## 2018-08-04 DIAGNOSIS — M25562 Pain in left knee: Secondary | ICD-10-CM | POA: Diagnosis not present

## 2018-08-04 DIAGNOSIS — E1159 Type 2 diabetes mellitus with other circulatory complications: Secondary | ICD-10-CM | POA: Diagnosis not present

## 2018-08-04 DIAGNOSIS — E119 Type 2 diabetes mellitus without complications: Secondary | ICD-10-CM | POA: Diagnosis not present

## 2018-08-04 DIAGNOSIS — M6281 Muscle weakness (generalized): Secondary | ICD-10-CM | POA: Diagnosis not present

## 2018-08-07 DIAGNOSIS — M6281 Muscle weakness (generalized): Secondary | ICD-10-CM | POA: Diagnosis not present

## 2018-08-07 DIAGNOSIS — M25562 Pain in left knee: Secondary | ICD-10-CM | POA: Diagnosis not present

## 2018-08-07 DIAGNOSIS — M25662 Stiffness of left knee, not elsewhere classified: Secondary | ICD-10-CM | POA: Diagnosis not present

## 2018-08-08 DIAGNOSIS — E119 Type 2 diabetes mellitus without complications: Secondary | ICD-10-CM | POA: Diagnosis not present

## 2018-08-08 DIAGNOSIS — L97421 Non-pressure chronic ulcer of left heel and midfoot limited to breakdown of skin: Secondary | ICD-10-CM | POA: Diagnosis not present

## 2018-08-13 DIAGNOSIS — M6281 Muscle weakness (generalized): Secondary | ICD-10-CM | POA: Diagnosis not present

## 2018-08-15 DIAGNOSIS — M25662 Stiffness of left knee, not elsewhere classified: Secondary | ICD-10-CM | POA: Diagnosis not present

## 2018-08-15 DIAGNOSIS — M25562 Pain in left knee: Secondary | ICD-10-CM | POA: Diagnosis not present

## 2018-08-15 DIAGNOSIS — M6281 Muscle weakness (generalized): Secondary | ICD-10-CM | POA: Diagnosis not present

## 2018-08-18 DIAGNOSIS — M25662 Stiffness of left knee, not elsewhere classified: Secondary | ICD-10-CM | POA: Diagnosis not present

## 2018-08-18 DIAGNOSIS — M6281 Muscle weakness (generalized): Secondary | ICD-10-CM | POA: Diagnosis not present

## 2018-08-18 DIAGNOSIS — M25562 Pain in left knee: Secondary | ICD-10-CM | POA: Diagnosis not present

## 2018-08-20 DIAGNOSIS — E785 Hyperlipidemia, unspecified: Secondary | ICD-10-CM | POA: Diagnosis not present

## 2018-08-20 DIAGNOSIS — E1169 Type 2 diabetes mellitus with other specified complication: Secondary | ICD-10-CM | POA: Diagnosis not present

## 2018-08-20 DIAGNOSIS — E1159 Type 2 diabetes mellitus with other circulatory complications: Secondary | ICD-10-CM | POA: Diagnosis not present

## 2018-08-22 DIAGNOSIS — M6281 Muscle weakness (generalized): Secondary | ICD-10-CM | POA: Diagnosis not present

## 2018-08-22 DIAGNOSIS — M25662 Stiffness of left knee, not elsewhere classified: Secondary | ICD-10-CM | POA: Diagnosis not present

## 2018-08-22 DIAGNOSIS — M25562 Pain in left knee: Secondary | ICD-10-CM | POA: Diagnosis not present

## 2018-09-16 IMAGING — CT CT KNEE*L* W/O CM
4 of 9 series · 12 of 33 positions shown, 13 images · non-contrast
Comparison: CT abdomen pelvis dated September 14, 2016. Left knee
MRI dated March 26, 2011.

CLINICAL DATA: Chronic left knee pain.

EXAM:
CT OF THE LEFT KNEE WITHOUT CONTRAST
TECHNIQUE: Multidetector CT imaging of the left knee was performed according to
the standard protocol. Multiplanar CT image reconstructions were
also generated.

[Series 3: axial bone knee · axial · 0.34mm/px · z∈[+997,+1171]mm · 3 of 349 slices shown, 4 images]
[im 88/349  soft-tissue]
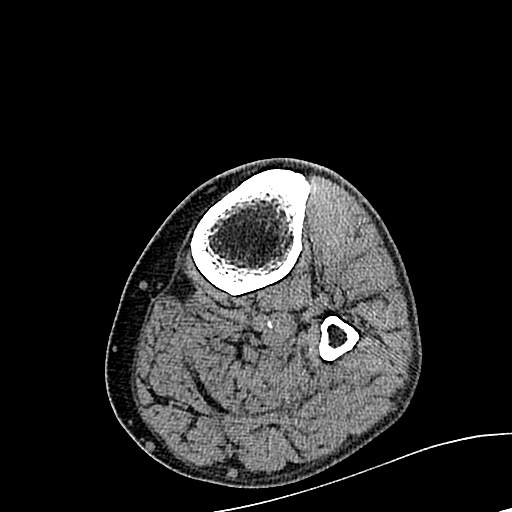
[im 88/349  bone]
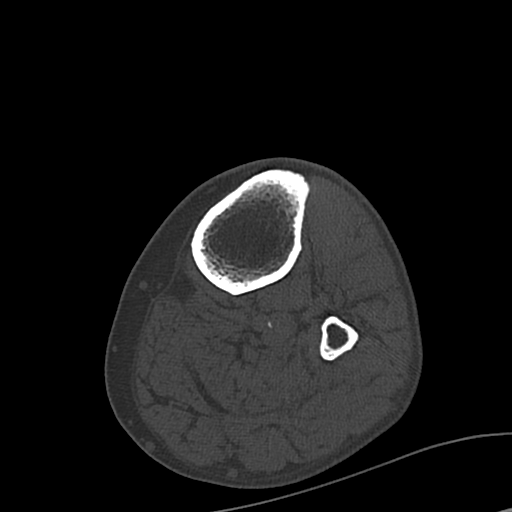
[im 175/349  bone]
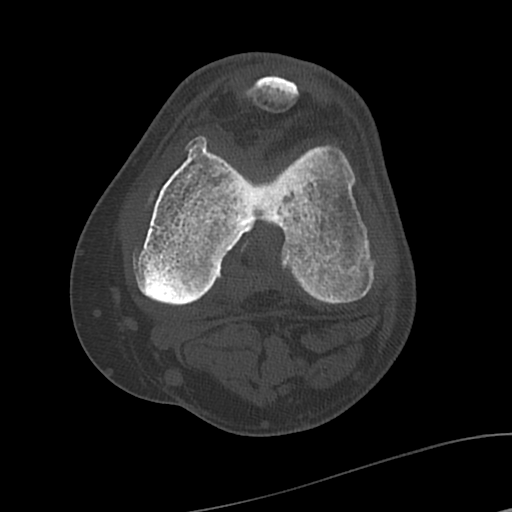
[im 262/349  bone]
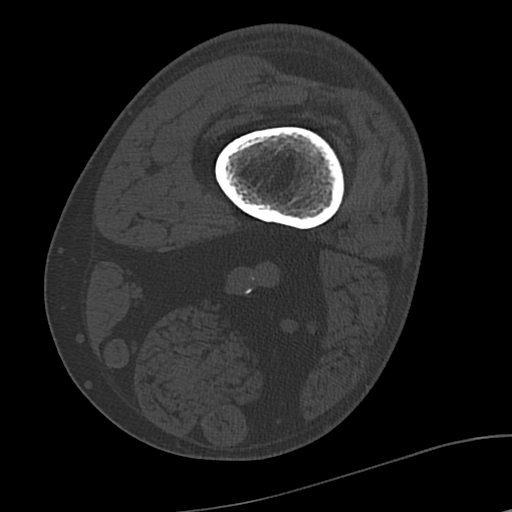

[Series 4: axial st knee · axial · 0.34mm/px · z∈[+997,+1171]mm · 3 of 349 slices shown]
[im 88/349  bone]
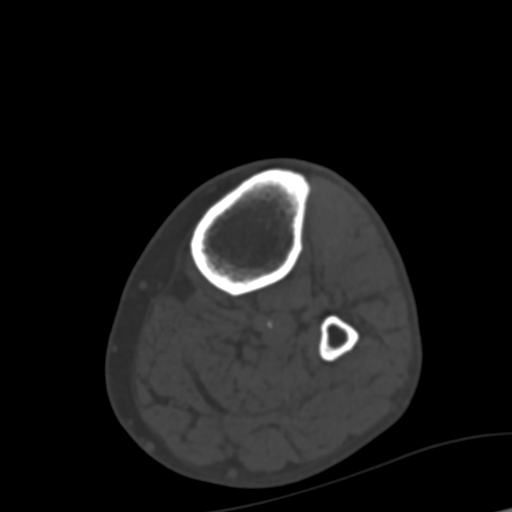
[im 175/349  bone]
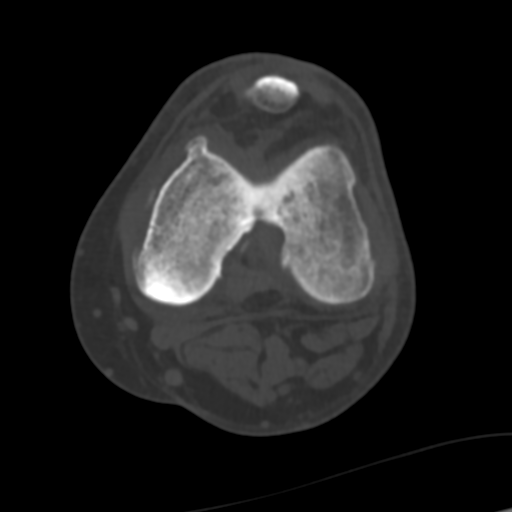
[im 262/349  bone]
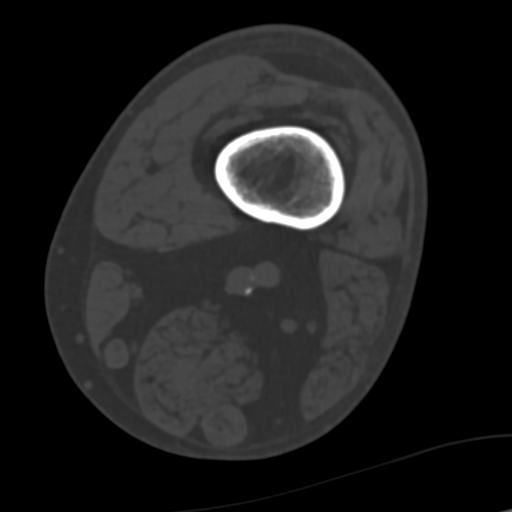

[Series 5: coronal bone knee · coronal · 0.34mm/px · 1 of 87 slices shown]
[im 44/87  bone]
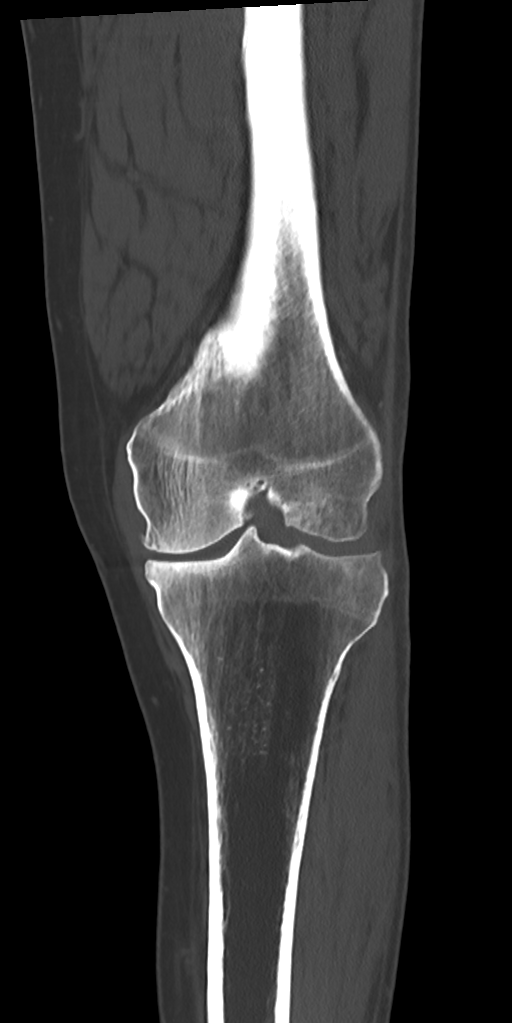

[Series 9: sagittal bone knee · sagittal · 0.34mm/px · 5 of 87 slices shown]
[im 15/87  bone]
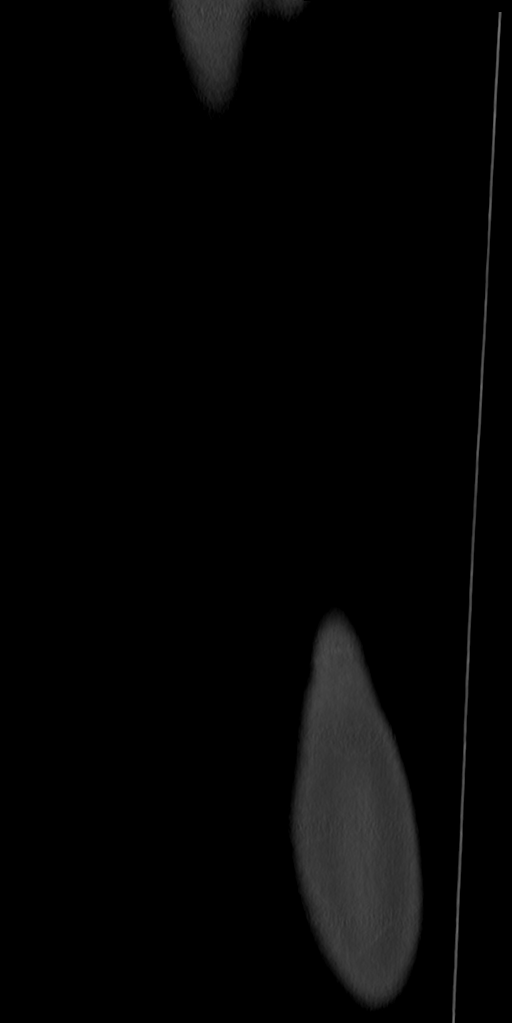
[im 29/87  bone]
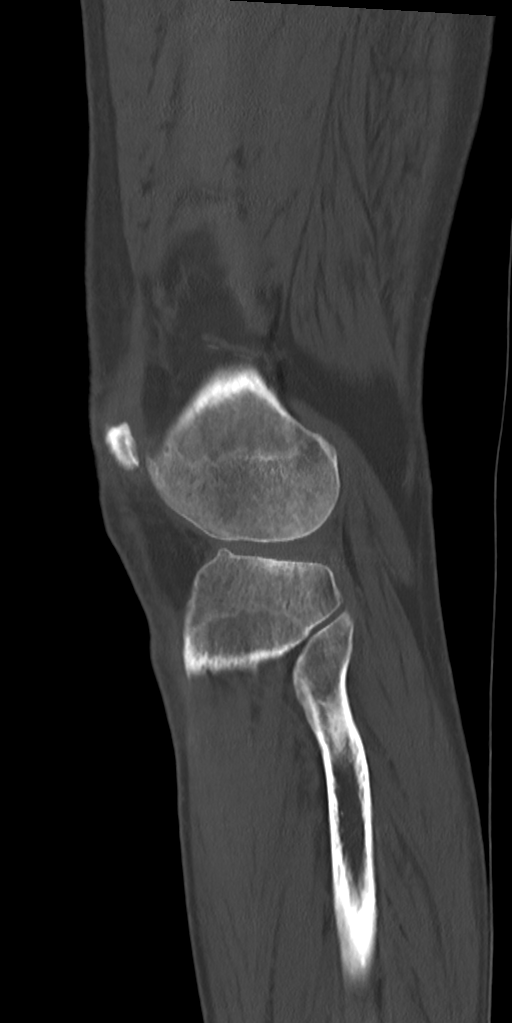
[im 44/87  bone]
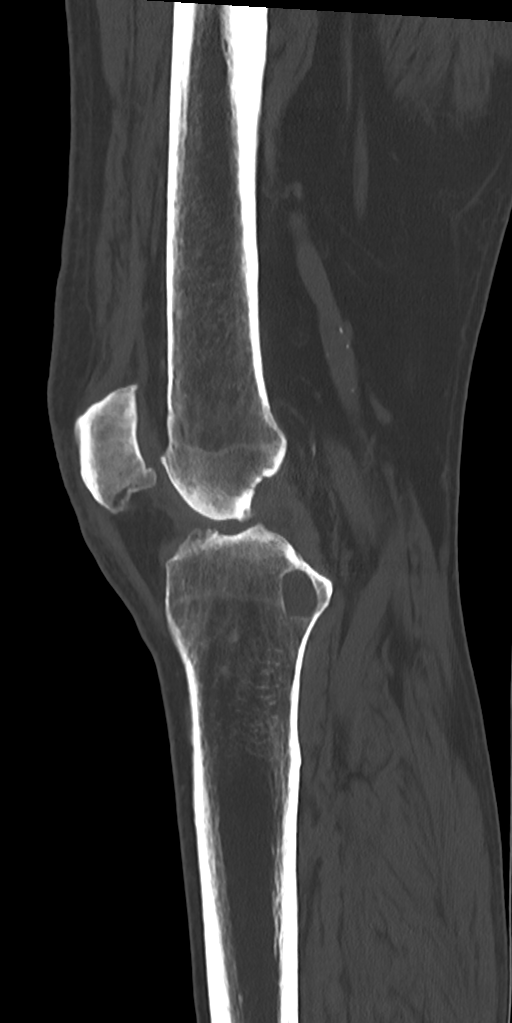
[im 58/87  bone]
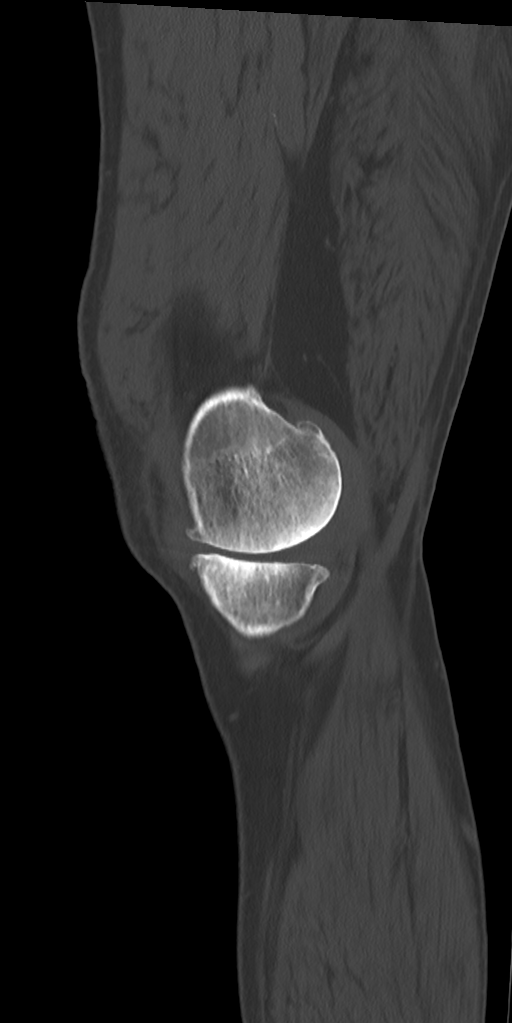
[im 72/87  bone]
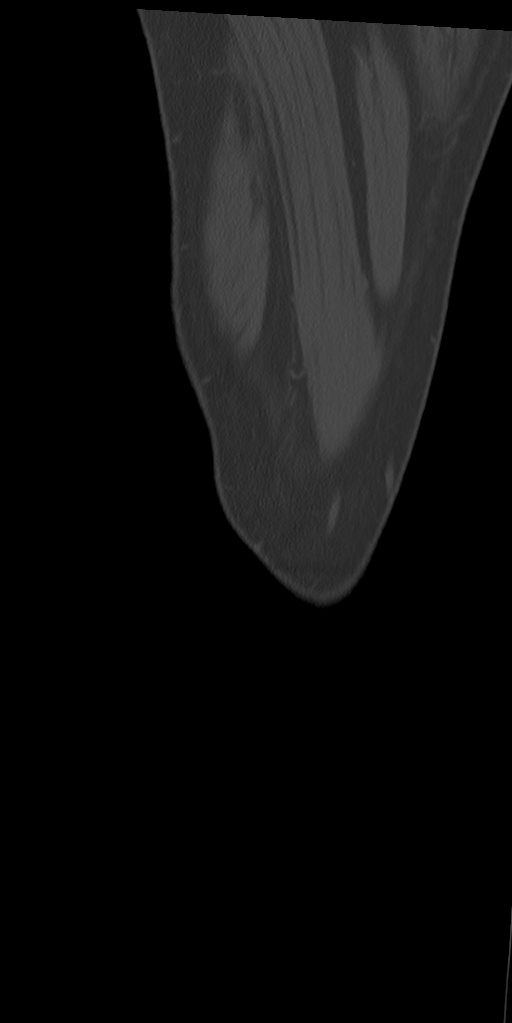

[12 of 33 positions shown; findings below may reference images not displayed]

FINDINGS: The hip demonstrates no fracture or dislocation. There is no lytic
or blastic lesion. Mild patchy sclerosis in the femoral head,
consistent with chronic avascular necrosis, unchanged when compared
to August 2016. No subchondral collapse.

The knee demonstrates no fracture or dislocation. Well-defined,
nonaggressive appearing lesion in the posterior mesial aspect of the
medial tibial plateau likely represents a large degenerative cyst
versus intraosseous ganglion. Mild-to-moderate medial compartment
joint space narrowing with subchondral sclerosis. Mild
patellofemoral compartment joint space narrowing. Small
tricompartmental osteophytes. No significant joint effusion. The
extensor mechanism is intact. Mild diffuse fatty infiltration of the
muscles about the knee. Vascular calcifications.

The ankle demonstrates no fracture or dislocation. There is no lytic
or blastic lesion.
IMPRESSION: 1. Tricompartmental knee osteoarthritis, mild-to-moderate in the
medial compartment.
2. Chronic avascular necrosis of the left femoral head without
subchondral collapse.

## 2018-11-28 DIAGNOSIS — L570 Actinic keratosis: Secondary | ICD-10-CM | POA: Diagnosis not present

## 2019-01-16 DIAGNOSIS — E1169 Type 2 diabetes mellitus with other specified complication: Secondary | ICD-10-CM | POA: Diagnosis not present

## 2019-01-16 DIAGNOSIS — I10 Essential (primary) hypertension: Secondary | ICD-10-CM | POA: Diagnosis not present

## 2019-01-16 DIAGNOSIS — E785 Hyperlipidemia, unspecified: Secondary | ICD-10-CM | POA: Diagnosis not present

## 2019-01-16 DIAGNOSIS — E1159 Type 2 diabetes mellitus with other circulatory complications: Secondary | ICD-10-CM | POA: Diagnosis not present

## 2019-01-22 DIAGNOSIS — Z0000 Encounter for general adult medical examination without abnormal findings: Secondary | ICD-10-CM | POA: Diagnosis not present

## 2019-01-22 DIAGNOSIS — E1169 Type 2 diabetes mellitus with other specified complication: Secondary | ICD-10-CM | POA: Diagnosis not present

## 2019-01-22 DIAGNOSIS — E1159 Type 2 diabetes mellitus with other circulatory complications: Secondary | ICD-10-CM | POA: Diagnosis not present

## 2019-08-24 ENCOUNTER — Other Ambulatory Visit: Payer: Self-pay | Admitting: Physical Medicine and Rehabilitation

## 2019-08-24 DIAGNOSIS — M5416 Radiculopathy, lumbar region: Secondary | ICD-10-CM

## 2019-09-07 ENCOUNTER — Ambulatory Visit
Admission: RE | Admit: 2019-09-07 | Discharge: 2019-09-07 | Disposition: A | Discharge status: Home / Self Care | Payer: 59 | Source: Ambulatory Visit | Attending: Physical Medicine and Rehabilitation | Admitting: Physical Medicine and Rehabilitation

## 2019-09-07 ENCOUNTER — Other Ambulatory Visit: Payer: Self-pay

## 2019-09-07 DIAGNOSIS — M5416 Radiculopathy, lumbar region: Secondary | ICD-10-CM | POA: Diagnosis not present

## 2019-11-10 ENCOUNTER — Other Ambulatory Visit: Payer: Self-pay | Admitting: Neurosurgery

## 2019-11-10 DIAGNOSIS — G894 Chronic pain syndrome: Secondary | ICD-10-CM

## 2019-11-20 ENCOUNTER — Ambulatory Visit
Admission: RE | Admit: 2019-11-20 | Discharge: 2019-11-20 | Disposition: A | Discharge status: Home / Self Care | Payer: 59 | Source: Ambulatory Visit | Attending: Neurosurgery | Admitting: Neurosurgery

## 2019-11-20 ENCOUNTER — Other Ambulatory Visit: Payer: Self-pay

## 2019-11-20 DIAGNOSIS — G894 Chronic pain syndrome: Secondary | ICD-10-CM | POA: Insufficient documentation

## 2019-11-25 ENCOUNTER — Ambulatory Visit: Payer: 59 | Admitting: Student in an Organized Health Care Education/Training Program

## 2019-11-26 ENCOUNTER — Ambulatory Visit: Payer: 59 | Admitting: Student in an Organized Health Care Education/Training Program

## 2019-12-01 NOTE — Progress Notes (Signed)
Patient: Jerry Morgan  Service Category: E/M  Provider: Gillis Santa, MD  DOB: Jan 13, 1961  DOS: 12/02/2019  Referring Provider: Deetta Perla, MD  MRN: [id number]  Setting: Ambulatory outpatient  PCP: Dion Body, MD  Type: New Patient  Specialty: Interventional Pain Management    Location: Office  Delivery: Face-to-face     Primary Reason(s) for Visit: Encounter for initial evaluation of one or more chronic problems (new to examiner) potentially causing chronic pain, and posing a threat to normal musculoskeletal function. (Level of risk: High) CC: Back Pain (lower)  HPI  [name] is a 59 y.o. year old, male patient, who comes today to see [place] for the first time for an initial evaluation of his chronic pain. He has Multiple closed fractures of ribs of left side; Blunt chest trauma; HTN (hypertension); DM (diabetes mellitus) ([place]); Lymphadenopathy; Urinary retention; Avascular necrosis of femoral head, left ([place]); Class 1 obesity due to excess calories with serious comorbidity and body mass index (BMI) of 33.0 to 33.9 in adult; Heart murmur, systolic; Hyperlipidemia due to type 2 diabetes mellitus ([place]); Other male erectile dysfunction; Type 2 diabetes mellitus without complication, without long-term current use of insulin ([place]); Vaccine counseling; Status post total knee replacement using cement, left; Neuroforaminal stenosis of lumbar spine; Chronic radicular lumbar pain; Lumbar spondylosis; and Chronic pain syndrome on their problem list. Today he comes in for evaluation of his Back Pain (lower)  Pain Assessment: Location: Lower Back Radiating: denies Onset: More than a month ago Duration: Chronic pain Quality: Sharp Severity: 10-Worst pain ever/10 (subjective, self-reported pain score)  Note: Reported level is inconsistent with clinical observations.                         When using our objective Pain Scale, levels between 6 and 10/10 are said to belong in an emergency room, as it  progressively worsens from a 6/10, described as severely limiting, requiring emergency care not usually available at an outpatient pain management facility. At a 6/10 level, communication becomes difficult and requires great effort. Assistance to reach the emergency department may be required. Facial flushing and profuse sweating along with potentially dangerous increases in heart rate and blood pressure will be evident. Effect on ADL: difficulty performing daily activities Timing: Intermittent Modifying factors: sitting BP: 120/85  HR: 96  Onset and Duration: Gradual Cause of pain: Unknown Severity: Getting worse, NAS-11 at its worse: 10/10, NAS-11 at its best: 5/10, NAS-11 now: 7/10 and NAS-11 on the average: 7/10 Timing: Not influenced by the time of the day and During activity or exercise Aggravating Factors: Bending, Kneeling, Lifiting, Prolonged standing and Walking Alleviating Factors: Sitting and Warm showers or baths Associated Problems: Nausea and Pain that wakes patient up Quality of Pain: Sharp Previous Examinations or Tests: CT scan, X-rays, Neurological evaluation, Neurosurgical evaluation and Orthopedic evaluation Previous Treatments: Epidural steroid injections, Physical Therapy and Strengthening exercises  The patient comes into the clinics today for the first time for a spinal cord stimulator trial evaluation.  Patient is a 59 year old male who presents with a chief complaint of persistent low back and buttock pain.  Occasionally this will radiate into his buttocks and his posterior lateral thigh.  It is relieved with rest but worsens with weightbearing and or walking.  This has been going on for the last year.  He has tried medication management, TENS unit, physical therapy as well as spinal injections.  He states that the spinal injections were helpful  however did not last for a long time.  He denies any radiation of pain to his lower extremities past his knee.  Of note  patient had bilateral S1 transforaminal ESI on 08/04/2019 which provided moderate improvement in symptoms for a short period of time.  He is being referred here by Dr. Lacinda Axon for consideration of spinal cord stimulator trial for persistent axial low back pain.  Of note, patient does occasionally utilize marijuana to help manage his pain.  He is currently not on chronic opioid therapy.  He has tried tizanidine and meloxicam in the past as well.  Meds   Current Outpatient Medications:  .  amLODipine (NORVASC) 10 MG tablet, Take 10 mg by mouth daily. , Disp: , Rfl:  .  aspirin EC 81 MG tablet, Take 81 mg by mouth daily. , Disp: , Rfl:  .  diphenhydramine-acetaminophen (TYLENOL PM) 25-500 MG TABS tablet, Take 2 tablets by mouth at bedtime as needed (for sleep.)., Disp: , Rfl:  .  Empagliflozin-metFORMIN HCl ER (SYNJARDY XR) 12.01-999 MG TB24, Take 1 tablet by mouth 2 (two) times daily. , Disp: , Rfl:  .  hydrochlorothiazide (HYDRODIURIL) 12.5 MG tablet, Take 12.5 mg by mouth daily., Disp: , Rfl: 3 .  losartan (COZAAR) 100 MG tablet, Take 1 tablet (100 mg total) by mouth daily. (Patient taking differently: Take 100 mg by mouth daily at 2 PM. ), Disp: 30 tablet, Rfl: 3 .  pantoprazole (PROTONIX) 40 MG tablet, Take 40 mg by mouth daily before breakfast. , Disp: , Rfl:  .  rosuvastatin (CRESTOR) 20 MG tablet, Take 20 mg by mouth daily at 2 PM. , Disp: , Rfl:  .  sitaGLIPtin (JANUVIA) 100 MG tablet, Take 1 tablet (100 mg total) by mouth daily. Gave samples- hold Rx for now (Patient taking differently: Take 100 mg by mouth daily. \), Disp: 30 tablet, Rfl: 0 .  Testosterone 10 MG/ACT (2%) GEL, Apply topically., Disp: , Rfl:  .  temazepam (RESTORIL) 15 MG capsule, Take 15 mg by mouth at bedtime as needed for sleep. , Disp: , Rfl: 0  Imaging Review    Thoracic MR wo contrast:  Results for orders placed during the hospital encounter of 11/20/19  MR THORACIC SPINE WO CONTRAST   Narrative CLINICAL DATA:  Mid  back pain.  EXAM: MRI THORACIC SPINE WITHOUT CONTRAST  TECHNIQUE: Multiplanar, multisequence MR imaging of the thoracic spine was performed. No intravenous contrast was administered.  COMPARISON:  None.  FINDINGS: Alignment:  Physiologic.  Vertebrae: No fracture, evidence of discitis, or bone lesion. Endplate degenerative changes are noted from T7-8 through L1-2.  Cord: Flattening of the cord is noted at T7-8 and T9-10 without cord signal abnormality.  Paraspinal and other soft tissues: Negative.  Disc levels:  T1-2 through T5-6: No spinal canal or neural foraminal stenosis.  C6-7: Minimal posterior disc protrusion. No spinal canal or neural foraminal stenosis.  T7-8: Posterior disc protrusion causing indentation on the anterior surface of the spinal cord, without cord signal abnormality. There is minimal narrowing of the spinal canal. No significant neural foraminal narrowing.  T7-8: No spinal canal or neural foraminal stenosis.  T9-10: Right paracentral disc protrusion causing indentation on the right anterior surface of the spinal cord without cord signal abnormality. There is minimal narrowing of the spinal canal. No significant neural foraminal stenosis.  T10-11: Facet degenerative changes without significant spinal canal or neural foraminal stenosis.  T11-12: Facet degenerative changes without significant spinal canal or neural foraminal stenosis.  T12-L1: Facet degenerative changes without significant spinal canal or neural foraminal stenosis.  IMPRESSION: Posterior disc protrusion at T7-8 and T9-10 causing indentation on the anterior surface of the spinal cord without cord signal abnormality.   Electronically Signed   By: Pedro Earls M.D.   On: 11/20/2019 11:28     Lumbosacral Imaging: Lumbar MR wo contrast:  Results for orders placed during the hospital encounter of 09/07/19  MR LUMBAR SPINE WO CONTRAST   Narrative CLINICAL  DATA:  Scoliosis. Low back pain. Pain is worse after standing for long periods.  EXAM: MRI LUMBAR SPINE WITHOUT CONTRAST  TECHNIQUE: Multiplanar, multisequence MR imaging of the lumbar spine was performed. No intravenous contrast was administered.  COMPARISON:  CT of the abdomen and pelvis 09/14/2016  FINDINGS: Segmentation: 5 non rib-bearing lumbar type vertebral bodies are present. A transitional S1 segment is present. A small disc is present at S1-2.  Alignment: Slight retrolisthesis is present at L1-2 and L2-3. There is straightening of the normal lumbar lordosis. Rightward curvature of the lumbar spine is centered at L2-3.  Vertebrae: Multilevel fatty endplate marrow changes are present. Sclerotic endplate marrow changes are most evident at L2-3.  Conus medullaris and cauda equina: Conus extends to the L1-2 level. Conus and cauda equina appear normal.  Paraspinal and other soft tissues: Limited imaging the abdomen is unremarkable. There is no significant adenopathy. No solid organ lesions are present.  Disc levels:  L1-2: A leftward disc protrusion is present. Mild left foraminal narrowing is present. The central canal is patent.  L2-3: Broad-based disc protrusion is present. Prominent epidural fat is present. Nerve roots are crowded within the thecal sac. Moderate left and mild right foraminal stenosis is present.  L3-4: A leftward disc protrusion is present. Prominent epidural fat is present. Nerve roots are crowded within the thecal sac. Moderate left and mild right foraminal stenosis is present.  L4-5: A broad-based disc protrusion is present. Prominent epidural fat is present. Nerve roots are crowded centrally. Moderate right and mild left foraminal stenosis is present.  L5-S1: Mild disc bulging is asymmetric to the right. Facet hypertrophy is worse on the right. Moderate right and mild left foraminal stenosis is present.  IMPRESSION: 1. Multilevel  spondylosis of the lumbar spine as described. 2. Dextro convex scoliosis of the spine is centered at L2-3. 3. Epidural lipomatosis to moderate central canal stenosis at L2-3, L3-4, and L4-5. 4. Moderate left and mild right foraminal stenosis at L2-3 and L3-4. 5. Moderate right and mild left foraminal stenosis at L4-5 and L5-S1. 6.   Electronically Signed   By: San Morelle M.D.   On: 09/07/2019 13:56    Knee-L CT wo contrast:  Results for orders placed during the hospital encounter of 04/22/18  CT KNEE LEFT WO CONTRAST   Narrative CLINICAL DATA:  Chronic left knee pain.  EXAM: CT OF THE LEFT KNEE WITHOUT CONTRAST  TECHNIQUE: Multidetector CT imaging of the left knee was performed according to the standard protocol. Multiplanar CT image reconstructions were also generated.  COMPARISON:  CT abdomen pelvis dated September 14, 2016. Left knee MRI dated March 26, 2011.  FINDINGS: The hip demonstrates no fracture or dislocation. There is no lytic or blastic lesion. Mild patchy sclerosis in the femoral head, consistent with chronic avascular necrosis, unchanged when compared to December 2017. No subchondral collapse.  The knee demonstrates no fracture or dislocation. Well-defined, nonaggressive appearing lesion in the posterior mesial aspect of the medial tibial plateau likely represents  a large degenerative cyst versus intraosseous ganglion. Mild-to-moderate medial compartment joint space narrowing with subchondral sclerosis. Mild patellofemoral compartment joint space narrowing. Small tricompartmental osteophytes. No significant joint effusion. The extensor mechanism is intact. Mild diffuse fatty infiltration of the muscles about the knee. Vascular calcifications.  The ankle demonstrates no fracture or dislocation. There is no lytic or blastic lesion.  IMPRESSION: 1. Tricompartmental knee osteoarthritis, mild-to-moderate in the medial compartment. 2. Chronic  avascular necrosis of the left femoral head without subchondral collapse.   Electronically Signed   By: William T Derry M.D.   On: 04/22/2018 09:55    Knee-L DG 1-2 views:  Results for orders placed during the hospital encounter of 06/10/18  DG Knee 1-2 Views Left   Narrative CLINICAL DATA:  Post left knee replacement  EXAM: LEFT KNEE - 1-2 VIEW  COMPARISON:  CT of the left knee of 04/22/2018  FINDINGS: The femoral and tibial components of the left total knee replacement are in good position. No complicating features are seen. A small amount air is noted in the joint space and adjacent soft tissues postoperatively.  IMPRESSION: Left total knee replacement components in good position. No complicating feature.   Electronically Signed   By: Paul  Barry M.D.   On: 06/10/2018 12:23     Complexity Note: Imaging results reviewed. Results shared with Mr. Brannan, using Layman's terms.                         ROS  Cardiovascular: High blood pressure, Heart murmur and Needs antibiotics prior to dental procedures Pulmonary or Respiratory: No reported pulmonary signs or symptoms such as wheezing and difficulty taking a deep full breath (Asthma), difficulty blowing air out (Emphysema), coughing up mucus (Bronchitis), persistent dry cough, or temporary stoppage of breathing during sleep Neurological: Curved spine Psychological-Psychiatric: No reported psychological or psychiatric signs or symptoms such as difficulty sleeping, anxiety, depression, delusions or hallucinations (schizophrenial), mood swings (bipolar disorders) or suicidal ideations or attempts Gastrointestinal: Vomiting blood (Ulcers) and No reported gastrointestinal signs or symptoms such as vomiting or evacuating blood, reflux, heartburn, alternating episodes of diarrhea and constipation, inflamed or scarred liver, or pancreas or irrregular and/or infrequent bowel movements Genitourinary: No reported renal or  genitourinary signs or symptoms such as difficulty voiding or producing urine, peeing blood, non-functioning kidney, kidney stones, difficulty emptying the bladder, difficulty controlling the flow of urine, or chronic kidney disease Hematological: No reported hematological signs or symptoms such as prolonged bleeding, low or poor functioning platelets, bruising or bleeding easily, hereditary bleeding problems, low energy levels due to low hemoglobin or being anemic Endocrine: High blood sugar controlled without the use of insulin (NIDDM) Rheumatologic: No reported rheumatological signs and symptoms such as fatigue, joint pain, tenderness, swelling, redness, heat, stiffness, decreased range of motion, with or without associated rash Musculoskeletal: Negative for myasthenia gravis, muscular dystrophy, multiple sclerosis or malignant hyperthermia Work History: Retired  Allergies  Mr. Millington is allergic to penicillins.  Laboratory Chemistry Profile   Renal Lab Results  Component Value Date   BUN 22 (H) 06/18/2018   CREATININE 0.71 06/18/2018   BCR 29 (H) 10/15/2016   GFRAA >60 06/18/2018   GFRNONAA >60 06/18/2018   PROTEINUR NEGATIVE 06/18/2018    Electrolytes Lab Results  Component Value Date   NA 130 (L) 06/18/2018   K 4.6 06/18/2018   CL 91 (L) 06/18/2018   CALCIUM 9.7 06/18/2018   PHOS 4.7 (H) 10/15/2016    Hepatic Lab   Results  Component Value Date   AST 46 (H) 09/17/2016   ALT 63 09/17/2016   ALBUMIN 4.5 10/15/2016   ALKPHOS 80 09/17/2016    ID Lab Results  Component Value Date   STAPHAUREUS NEGATIVE 05/14/2018   MRSAPCR NEGATIVE 05/14/2018    Bone No results found for: VD25OH, VD125OH2TOT, VD3125OH2, VD2125OH2, 25OHVITD1, 25OHVITD2, 25OHVITD3, TESTOFREE, TESTOSTERONE  Endocrine Lab Results  Component Value Date   GLUCOSE 138 (H) 06/18/2018   GLUCOSEU >=500 (A) 06/18/2018   HGBA1C 9.4 (H) 10/15/2016   TSH 0.488 10/15/2016    Neuropathy Lab Results  Component  Value Date   HGBA1C 9.4 (H) 10/15/2016    CNS No results found for: COLORCSF, APPEARCSF, RBCCOUNTCSF, WBCCSF, POLYSCSF, LYMPHSCSF, EOSCSF, PROTEINCSF, GLUCCSF, JCVIRUS, CSFOLI, IGGCSF, LABACHR, ACETBL, LABACHR, ACETBL  Inflammation (CRP: Acute  ESR: Chronic) Lab Results  Component Value Date   ESRSEDRATE 37 (H) 05/27/2018    Rheumatology No results found for: RF, ANA, LABURIC, URICUR, LYMEIGGIGMAB, LYMEABIGMQN, HLAB27  Coagulation Lab Results  Component Value Date   INR 0.94 05/14/2018   LABPROT 12.5 05/14/2018   APTT 31 05/14/2018   PLT 396 06/18/2018    Cardiovascular Lab Results  Component Value Date   TROPONINI <0.03 06/18/2018   HGB 10.8 (L) 06/18/2018   HCT 31.0 (L) 06/18/2018    Screening Lab Results  Component Value Date   STAPHAUREUS NEGATIVE 05/14/2018   MRSAPCR NEGATIVE 05/14/2018    Cancer No results found for: CEA, CA125, LABCA2  Allergens No results found for: ALMOND, APPLE, ASPARAGUS, AVOCADO, BANANA, BARLEY, BASIL, BAYLEAF, GREENBEAN, LIMABEAN, WHITEBEAN, BEEFIGE, REDBEET, BLUEBERRY, BROCCOLI, CABBAGE, MELON, CARROT, CASEIN, CASHEWNUT, CAULIFLOWER, CELERY    Note: Lab results reviewed.   PFSH  Drug: Mr. Counihan  reports current drug use. Drug: Marijuana. Alcohol:  reports current alcohol use of about 14.0 standard drinks of alcohol per week. Tobacco:  reports that he has never smoked. He has never used smokeless tobacco. Medical:  has a past medical history of Arthritis, GERD (gastroesophageal reflux disease), Hypertension, Rib fracture (09/12/2016), and Type II diabetes mellitus (HCC). Family: family history includes Heart disease in his father; Hypertension in his father and mother.  Past Surgical History:  Procedure Laterality Date  . COLONOSCOPY WITH PROPOFOL N/A 02/09/2016   Procedure: COLONOSCOPY WITH PROPOFOL;  Surgeon: Paul Y Oh, MD;  Location: [facility];  Service: Gastroenterology;  Laterality: N/A;  . ESOPHAGOGASTRODUODENOSCOPY (EGD) WITH  PROPOFOL N/A 02/09/2016   Procedure: ESOPHAGOGASTRODUODENOSCOPY (EGD) WITH PROPOFOL;  Surgeon: Paul Y Oh, MD;  Location: [facility] ENDOSCOPY;  Service: Gastroenterology;  Laterality: N/A;  . JOINT REPLACEMENT  2000s   "joints in big toes"   . KNEE ARTHROSCOPY Left ~ 2010  . toe implant    . TONSILLECTOMY    . TOTAL KNEE ARTHROPLASTY Left 06/10/2018   Procedure: TOTAL KNEE ARTHROPLASTY;  Surgeon: Menz, Michael, MD;  Location: [facility] ORS;  Service: Orthopedics;  Laterality: Left;   Active Ambulatory Problems    Diagnosis Date Noted  . Multiple closed fractures of ribs of left side 09/13/2016  . Blunt chest trauma 09/14/2016  . HTN (hypertension) 09/14/2016  . DM (diabetes mellitus) (HCC) 09/14/2016  . Lymphadenopathy 09/14/2016  . Urinary retention 09/14/2016  . Avascular necrosis of femoral head, left (HCC) 04/28/2018  . Class 1 obesity due to excess calories with serious comorbidity and body mass index (BMI) of 33.0 to 33.9 in adult 03/26/2017  . Heart murmur, systolic 03/26/2017  . Hyperlipidemia due to type 2 diabetes mellitus (HCC) 03/28/2017  .   Other male erectile dysfunction 07/30/2017  . Type 2 diabetes mellitus without complication, without long-term current use of insulin (HCC) 03/26/2017  . Vaccine counseling 12/12/2017  . Status post total knee replacement using cement, left 06/10/2018  . Neuroforaminal stenosis of lumbar spine 12/02/2019  . Chronic radicular lumbar pain 12/02/2019  . Lumbar spondylosis 12/02/2019  . Chronic pain syndrome 12/02/2019   Resolved Ambulatory Problems    Diagnosis Date Noted  . No Resolved Ambulatory Problems   Past Medical History:  Diagnosis Date  . Arthritis   . GERD (gastroesophageal reflux disease)   . Hypertension   . Rib fracture 09/12/2016  . Type II diabetes mellitus (HCC)    Constitutional Exam  General appearance: Well nourished, well developed, and well hydrated. In no apparent acute distress Vitals:   12/02/19 0951  BP: 120/85   Pulse: 96  Resp: 18  Temp: (!) 97.2 F (36.2 C)  TempSrc: Temporal  SpO2: 96%  Weight: 208 lb (94.3 kg)  Height: 6' (1.829 m)   BMI Assessment: Estimated body mass index is 28.21 kg/m as calculated from the following:   Height as of this encounter: 6' (1.829 m).   Weight as of this encounter: 208 lb (94.3 kg).  BMI interpretation table: BMI level Category Range association with higher incidence of chronic pain  <18 kg/m2 Underweight   18.5-24.9 kg/m2 Ideal body weight   25-29.9 kg/m2 Overweight Increased incidence by 20%  30-34.9 kg/m2 Obese (Class I) Increased incidence by 68%  35-39.9 kg/m2 Severe obesity (Class II) Increased incidence by 136%  >40 kg/m2 Extreme obesity (Class III) Increased incidence by 254%   Patient's current BMI Ideal Body weight  Body mass index is 28.21 kg/m. Ideal body weight: 77.6 kg (171 lb 1.2 oz) Adjusted ideal body weight: 84.3 kg (185 lb 13.5 oz)   BMI Readings from Last 4 Encounters:  12/02/19 28.21 kg/m  06/18/18 30.15 kg/m  06/10/18 30.15 kg/m  05/14/18 29.02 kg/m   Wt Readings from Last 4 Encounters:  12/02/19 208 lb (94.3 kg)  06/18/18 222 lb 4.8 oz (100.8 kg)  06/10/18 222 lb 4.8 oz (100.8 kg)  05/14/18 214 lb (97.1 kg)    Psych/Mental status: Alert, oriented x 3 (person, place, & time)       Eyes: PERLA Respiratory: No evidence of acute respiratory distress  Cervical Spine Exam  Skin & Axial Inspection: No masses, redness, edema, swelling, or associated skin lesions Alignment: Symmetrical Functional ROM: Unrestricted ROM      Stability: No instability detected Muscle Tone/Strength: Functionally intact. No obvious neuro-muscular anomalies detected. Sensory (Neurological): Unimpaired Palpation: No palpable anomalies              Upper Extremity (UE) Exam    Side: Right upper extremity  Side: Left upper extremity  Skin & Extremity Inspection: Skin color, temperature, and hair growth are WNL. No peripheral edema or  cyanosis. No masses, redness, swelling, asymmetry, or associated skin lesions. No contractures.  Skin & Extremity Inspection: Skin color, temperature, and hair growth are WNL. No peripheral edema or cyanosis. No masses, redness, swelling, asymmetry, or associated skin lesions. No contractures.  Functional ROM: Unrestricted ROM          Functional ROM: Unrestricted ROM          Muscle Tone/Strength: Functionally intact. No obvious neuro-muscular anomalies detected.  Muscle Tone/Strength: Functionally intact. No obvious neuro-muscular anomalies detected.  Sensory (Neurological): Unimpaired          Sensory (Neurological): Unimpaired            Palpation: No palpable anomalies              Palpation: No palpable anomalies              Provocative Test(s):  Phalen's test: deferred Tinel's test: deferred Apley's scratch test (touch opposite shoulder):  Action 1 (Across chest): deferred Action 2 (Overhead): deferred Action 3 (LB reach): deferred   Provocative Test(s):  Phalen's test: deferred Tinel's test: deferred Apley's scratch test (touch opposite shoulder):  Action 1 (Across chest): deferred Action 2 (Overhead): deferred Action 3 (LB reach): deferred    Thoracic Spine Area Exam  Skin & Axial Inspection: No masses, redness, or swelling Alignment: Symmetrical Functional ROM: Decreased ROM Stability: No instability detected Muscle Tone/Strength: Functionally intact. No obvious neuro-muscular anomalies detected. Sensory (Neurological): Musculoskeletal pain pattern Muscle strength & Tone: No palpable anomalies  Lumbar Exam  Skin & Axial Inspection: Thoraco-lumbar Scoliosis Alignment: Scoliosis detected Functional ROM: Decreased ROM affecting both sides Stability: No instability detected Muscle Tone/Strength: Functionally intact. No obvious neuro-muscular anomalies detected. Sensory (Neurological): Musculoskeletal pain pattern Palpation: Complains of area being tender to palpation        Provocative Tests: Hyperextension/rotation test: (+) bilaterally for facet joint pain. Lumbar quadrant test (Kemp's test): (+) bilateral for foraminal stenosis Lateral bending test: (+) due to pain. Patrick's Maneuver: deferred today                   FABER* test: deferred today                   S-I anterior distraction/compression test: deferred today         S-I lateral compression test: deferred today         S-I Thigh-thrust test: deferred today         S-I Gaenslen's test: deferred today         *(Flexion, ABduction and External Rotation)  Gait & Posture Assessment  Ambulation: Limited Gait: Antalgic gait (limping) Posture: Difficulty standing up straight, due to pain   Lower Extremity Exam    Side: Right lower extremity  Side: Left lower extremity  Stability: No instability observed          Stability: No instability observed          Skin & Extremity Inspection: Skin color, temperature, and hair growth are WNL. No peripheral edema or cyanosis. No masses, redness, swelling, asymmetry, or associated skin lesions. No contractures.  Skin & Extremity Inspection: Skin color, temperature, and hair growth are WNL. No peripheral edema or cyanosis. No masses, redness, swelling, asymmetry, or associated skin lesions. No contractures.  Functional ROM: Unrestricted ROM                  Functional ROM: Unrestricted ROM                  Muscle Tone/Strength: Functionally intact. No obvious neuro-muscular anomalies detected.  Muscle Tone/Strength: Functionally intact. No obvious neuro-muscular anomalies detected.  Sensory (Neurological): Unimpaired        Sensory (Neurological): Unimpaired        DTR: Patellar: deferred today Achilles: deferred today Plantar: deferred today  DTR: Patellar: deferred today Achilles: deferred today Plantar: deferred today  Palpation: No palpable anomalies  Palpation: No palpable anomalies   Assessment  Primary Diagnosis & Pertinent Problem List: The  primary encounter diagnosis was Neuroforaminal stenosis of lumbar spine. Diagnoses of Chronic radicular lumbar pain, Lumbar spondylosis, and Chronic pain syndrome were also pertinent to this   visit.  Visit Diagnosis (New problems to examiner): 1. Neuroforaminal stenosis of lumbar spine   2. Chronic radicular lumbar pain   3. Lumbar spondylosis   4. Chronic pain syndrome    Patient is a pleasant 59-year-old male with a history of persistent axial low back pain that radiates into the proximal buttocks related to lumbar neuroforaminal stenosis, multilevel most pronounced at L2-L3, L3-L4, L4-L5, L5-S1.  Patient also has multilevel spondylosis as well as scoliosis at L2-L3.  He is being referred by Dr. Cook for consideration of spinal cord stimulator trial for persistent low back pain.  I was able to evaluate the patient's interlaminar windows for percutaneous access under live fluoroscopy and they seem patent patent without any significant facet arthrosis.  I discussed  percutaneous spinal cord stimulator trial with the patient in detail. I explained to the patient that they will have an external power source and programmer which the patient will use for 7 days. There will be daily communication with the stimulator company and the patient. A possible need for a mid-trial clinic visit to give the patient the best chance of success.   Patient will need to have a thorough psychosocial behavioral evaluation. Our office will be happy to help facilitate this. Will place referral to Dr Feldman.   Some of patient's pain does seem to be mechanical in nature, with some component of neurogenic pain as well. We discussed the indications for spinal cord stimulation, specifically stating that it is typically better for neuropathic and appendicular pain, but that we have had some success in the treatment of low back and hip pain.   Patient is interested in proceeding with spinal cord stimulation trial. He understands  that this may not be successful, and that spinal cord stimulation in general is not a "magic bullet."  We discussed [employer] as the device company for the trial.  We had a lengthy and very detailed discussion of all the risks, benefits, alternatives, and rationale of surgery as well as the option of continuing nonsurgical therapies. We specifically discussed the risks of temporary or permanent worsened neurologic injury, no symptomatic relief or pain made worse after procedure, and also the need for future surgery (due to infection, CSF leak, bleeding, adjacent segment issues, bone-healing difficulties, and other related issues). No guarantees of outcome were made or implied and he is eager to proceed and presents for definitive treatment.   Mr. Carnes told me that all of his questions were answered thoroughly and to his satisfaction. Confidence and understanding of the discussed risks and consequences of  treatment was expressed and he accepted these risks and was eager to proceed with procedure pending psych evaluation.   Issues concerning treatment and diagnosis were discussed with the patient. There are no barriers to understanding the plan of treatment. Explanation was well received by patient and/or family who then verbalized understanding.   Plan: -Referral to psych for SCS trial eval -Plan for [employer] spinal cord stimulator trial after psych visit has been completed.  Plan of Care    Imaging Orders     DG PAIN CLINIC C-ARM 1-60 MIN NO REPORT  Referral Orders     Ambulatory referral to Psychology  Procedure Orders     [place] TRIAL  Interventional management options: Mr. Verville was informed that there is no guarantee that he would be a candidate for interventional therapies. The decision will be based on the results of diagnostic studies, as well as Mr. Datta's risk profile.  Procedure(s)   under consideration:  SCS trial   Provider-requested follow-up: Return for  After psych visit for [employer] SCS trial.  No future appointments.  Note by: Bilal Lateef, MD Date: 12/02/2019; Time: 11:59 AM 

## 2019-12-02 ENCOUNTER — Other Ambulatory Visit: Payer: Self-pay

## 2019-12-02 ENCOUNTER — Ambulatory Visit: Admission: RE | Admit: 2019-12-02 | Payer: 59 | Source: Ambulatory Visit

## 2019-12-02 ENCOUNTER — Ambulatory Visit: Payer: 59 | Admitting: Student in an Organized Health Care Education/Training Program

## 2019-12-02 ENCOUNTER — Ambulatory Visit
Admission: RE | Admit: 2019-12-02 | Discharge: 2019-12-02 | Disposition: A | Discharge status: Home / Self Care | Payer: 59 | Source: Ambulatory Visit | Attending: Student in an Organized Health Care Education/Training Program | Admitting: Student in an Organized Health Care Education/Training Program

## 2019-12-02 ENCOUNTER — Encounter: Payer: Self-pay | Admitting: Student in an Organized Health Care Education/Training Program

## 2019-12-02 VITALS — BP 120/85 | HR 96 | Temp 97.2°F | Resp 18 | Ht 72.0 in | Wt 208.0 lb

## 2019-12-02 DIAGNOSIS — M48061 Spinal stenosis, lumbar region without neurogenic claudication: Secondary | ICD-10-CM | POA: Insufficient documentation

## 2019-12-02 DIAGNOSIS — G894 Chronic pain syndrome: Secondary | ICD-10-CM | POA: Insufficient documentation

## 2019-12-02 DIAGNOSIS — M5416 Radiculopathy, lumbar region: Secondary | ICD-10-CM | POA: Insufficient documentation

## 2019-12-02 DIAGNOSIS — M9973 Connective tissue and disc stenosis of intervertebral foramina of lumbar region: Secondary | ICD-10-CM | POA: Insufficient documentation

## 2019-12-02 DIAGNOSIS — G8929 Other chronic pain: Secondary | ICD-10-CM | POA: Diagnosis present

## 2019-12-02 DIAGNOSIS — M47816 Spondylosis without myelopathy or radiculopathy, lumbar region: Secondary | ICD-10-CM | POA: Insufficient documentation

## 2020-01-13 ENCOUNTER — Telehealth: Payer: Self-pay | Admitting: Student in an Organized Health Care Education/Training Program

## 2020-01-13 NOTE — Telephone Encounter (Signed)
Called to inform patient that the insurance company has denied his spinal cord stimulator trial.  I will set up an appeal with [place] [employer] next week to try and advocate for patients case and obtain approval.

## 2020-03-10 ENCOUNTER — Other Ambulatory Visit: Payer: Self-pay

## 2020-03-10 ENCOUNTER — Emergency Department
Admission: EM | Admit: 2020-03-10 | Discharge: 2020-03-10 | Disposition: A | Discharge status: Home / Self Care | Payer: 59 | Attending: Emergency Medicine | Admitting: Emergency Medicine

## 2020-03-10 DIAGNOSIS — R55 Syncope and collapse: Secondary | ICD-10-CM | POA: Insufficient documentation

## 2020-03-10 DIAGNOSIS — Z5321 Procedure and treatment not carried out due to patient leaving prior to being seen by health care provider: Secondary | ICD-10-CM | POA: Insufficient documentation

## 2020-03-10 LAB — BASIC METABOLIC PANEL
Anion gap: 14 (ref 5–15)
BUN: 17 mg/dL (ref 8–23)
CO2: 20 mmol/L — ABNORMAL LOW (ref 22–32)
Calcium: 10 mg/dL (ref 8.9–10.3)
Chloride: 99 mmol/L (ref 98–111)
Creatinine, Ser: 1.18 mg/dL (ref 0.61–1.24)
GFR calc Af Amer: >60 mL/min (ref >60)
GFR calc non Af Amer: >60 mL/min (ref >60)
Glucose, Bld: 125 mg/dL — ABNORMAL HIGH (ref 70–99)
Potassium: 4.1 mmol/L (ref 3.5–5.1)
Sodium: 133 mmol/L — ABNORMAL LOW (ref 135–145)

## 2020-03-10 LAB — CBC
HCT: 35.8 % — ABNORMAL LOW (ref 39.0–52.0)
Hemoglobin: 12.3 g/dL — ABNORMAL LOW (ref 13.0–17.0)
MCH: 34.3 pg — ABNORMAL HIGH (ref 26.0–34.0)
MCHC: 34.4 g/dL (ref 30.0–36.0)
MCV: 99.7 fL (ref 80.0–100.0)
Platelets: 266 10*3/uL (ref 150–400)
RBC: 3.59 MIL/uL — ABNORMAL LOW (ref 4.22–5.81)
RDW: 12.5 % (ref 11.5–15.5)
WBC: 3.9 10*3/uL — ABNORMAL LOW (ref 4.0–10.5)
nRBC: 0 % (ref 0.0–0.2)

## 2020-03-10 MED ORDER — SODIUM CHLORIDE 0.9% FLUSH: 3.0000 mL | Freq: Once | INTRAVENOUS | Status: DC

## 2020-03-10 NOTE — ED Notes (Signed)
Called 1 time, no answer

## 2020-03-10 NOTE — ED Triage Notes (Addendum)
Pt comes via EMS with c/o LOC. Pt states he was outside and he fell. Pt states he did pass out according to family. Pt states he feels better now, but earlier he felt dizzy.  Pt denies hitting head and no blood thinners.  Pt has abrasion noted to bilateral knees.

## 2020-03-10 NOTE — ED Notes (Addendum)
BIB [facility] with c/o syncopal episode. Witnessed fall no LOC. Pt has abrasion to right knee. EMS reports pt didn't want to come.

## 2020-03-11 ENCOUNTER — Telehealth: Payer: Self-pay | Admitting: Emergency Medicine

## 2020-03-11 NOTE — Telephone Encounter (Signed)
Called patient due to lwot to inquire about condition and follow up plans. Left message.   

## 2020-05-26 ENCOUNTER — Other Ambulatory Visit: Payer: Self-pay | Admitting: Neurosurgery

## 2020-05-27 NOTE — Addendum Note (Signed)
Addended by: Deetta Perla on: 05/27/2020 04:35 PM   Modules accepted: Orders

## 2020-06-02 ENCOUNTER — Inpatient Hospital Stay: Admission: RE | Admit: 2020-06-02 | Payer: 59 | Source: Ambulatory Visit

## 2020-06-07 ENCOUNTER — Inpatient Hospital Stay: Admission: RE | Admit: 2020-06-07 | Payer: 59 | Source: Ambulatory Visit

## 2020-06-09 ENCOUNTER — Other Ambulatory Visit: Payer: 59

## 2020-06-13 ENCOUNTER — Encounter: Admission: RE | Payer: Self-pay | Source: Home / Self Care

## 2020-06-13 ENCOUNTER — Ambulatory Visit: Admission: RE | Admit: 2020-06-13 | Payer: 59 | Source: Home / Self Care | Admitting: Neurosurgery

## 2020-06-13 SURGERY — LUMBAR SPINAL CORD STIMULATOR TRIAL
Anesthesia: LOCAL

## 2020-10-08 ENCOUNTER — Other Ambulatory Visit: Payer: 59

## 2020-10-15 ENCOUNTER — Other Ambulatory Visit: Payer: 59

## 2021-06-15 ENCOUNTER — Other Ambulatory Visit: Payer: Self-pay | Admitting: Physician Assistant

## 2021-06-15 DIAGNOSIS — R1314 Dysphagia, pharyngoesophageal phase: Secondary | ICD-10-CM

## 2021-07-12 ENCOUNTER — Other Ambulatory Visit: Payer: Self-pay

## 2021-07-12 ENCOUNTER — Ambulatory Visit
Admission: RE | Admit: 2021-07-12 | Discharge: 2021-07-12 | Disposition: A | Discharge status: Home / Self Care | Payer: 59 | Source: Ambulatory Visit | Attending: Physician Assistant | Admitting: Physician Assistant

## 2021-07-12 DIAGNOSIS — R1314 Dysphagia, pharyngoesophageal phase: Secondary | ICD-10-CM | POA: Diagnosis not present

## 2021-07-12 NOTE — Therapy (Signed)
[place], [place], [address] Phone: [phone]   Fax:     Modified Barium Swallow  Patient Details  Name: Jerry Morgan MRN: [id number] Date of Birth: [birth year] No data recorded  Encounter Date: 07/12/2021   End of Session - 07/12/21 1409     Visit Number 1    Number of Visits 1    Date for SLP Re-Evaluation 07/12/21    SLP Start Time 1300    SLP Stop Time  1320    SLP Time Calculation (min) 20 min    Activity Tolerance Patient tolerated treatment well               There were no vitals filed for this visit.   Subjective Assessment - 07/12/21 1404     Subjective Reports gagging/regurgitations with "solids" "coarse foods" "roast beef sandwich"    Currently in Pain? No/denies              Objective Swallowing Evaluation: Type of Study: MBS-Modified Barium Swallow Study   Patient Details  Name: Jerry Morgan MRN: [id number] Date of Birth: July 09, 1961  Today's Date: 07/12/2021 Time: SLP Start Time (ACUTE ONLY): 1300 -SLP Stop Time (ACUTE ONLY): 1320  SLP Time Calculation (min) (ACUTE ONLY): 20 min   Past Medical History:  Past Medical History:  Diagnosis Date   Arthritis    GERD (gastroesophageal reflux disease)    Hypertension    Rib fracture 09/12/2016   after being jerked against a door frame by his son's dog.  Pt sustained Lt rib fractures 5-10 with flail chest segment 5-7/notes 09/13/2016   Type II diabetes mellitus ([place])    dx'd ~ 08/2016   Past Surgical History:  Past Surgical History:  Procedure Laterality Date   COLONOSCOPY WITH PROPOFOL N/A 02/09/2016   Procedure: COLONOSCOPY WITH PROPOFOL;  Surgeon: Hulen Luster, MD;  Location: [facility] ENDOSCOPY;  Service: Gastroenterology;  Laterality: N/A;   ESOPHAGOGASTRODUODENOSCOPY (EGD) WITH PROPOFOL N/A 02/09/2016   Procedure: ESOPHAGOGASTRODUODENOSCOPY (EGD) WITH PROPOFOL;  Surgeon: Hulen Luster, MD;   Location: [facility] ENDOSCOPY;  Service: Gastroenterology;  Laterality: N/A;   JOINT REPLACEMENT  2000s   "joints in big toes"    KNEE ARTHROSCOPY Left ~ 2010   toe implant     TONSILLECTOMY     TOTAL KNEE ARTHROPLASTY Left 06/10/2018   Procedure: TOTAL KNEE ARTHROPLASTY;  Surgeon: Hessie Knows, MD;  Location: [facility] ORS;  Service: Orthopedics;  Laterality: Left;   HPI: Patient is a 60 y.o. male with past medical history of HTN, HLD, DM2, anemia, obesity referred for MBS due to difficulty swallowing 5+months, reporting 10 lb unintentional weight loss.   Subjective: pleasant, cooperative    Assessment / Plan / Recommendation  CHL IP CLINICAL IMPRESSIONS 07/12/2021  Clinical Impression Patient presents with functional oropharyngeal swallow. Oral stage is characterized by adequate lip closure, bolus preparation, and anterior to posterior transit. Swallow initiation is timely at the level of the base of tongue. Pharyngeal stage is noted for adequate tongue base retraction, hyolaryngeal excursion, and pharyngeal constriction. Epiglottic deflection is complete; there is no penetration or aspiration. Pharyngeal stripping wave is complete with no abnormal pharyngeal residue. With solid, pt reported feeling it was "difficult" to swallow and was noted to thrust head forward and down as bolus passed the laryngeal vestibule. There was no residue or obstruction of flow correlating with pt's sensation. Amplitude/duration of cricopharyngeus opening is WFL. There is adequate/complete clearance through the  cervical esophagus. A limited esophageal sweep was performed in the upright position with mechanical soft solid and was unremarkable. Consistencies tested were thin liquids x2 tsps, 1cup sip, 3 sequential boluses, nectar x1 tsp, 1 cup sip, 3 sequential boluses, puree x1 heaping tsps, mechanical soft (1/4 graham cracker in applesauce x2). Patient reports difficulty eating solids, with instantaneous gagging/regurgitation.  Encouraged follow-up with GI (appt next month), as pt's symptoms may indicate esophageal dysphagia. Manometry may be beneficial for assessment of esophageal pressures given pt's attempt to exert additional force on solid boluses. SLP educated on reflux modifications and provided handout with this information. Recommend patient continue regular diet with thin liquids; no further ST indicated at this time.  SLP Visit Diagnosis Dysphagia, pharyngoesophageal phase (R13.14)  Attention and concentration deficit following --  Frontal lobe and executive function deficit following --  Impact on safety and function Mild aspiration risk      CHL IP TREATMENT RECOMMENDATION 07/12/2021  Treatment Recommendations No treatment recommended at this time     No flowsheet data found.  CHL IP DIET RECOMMENDATION 07/12/2021  SLP Diet Recommendations Regular solids;Dysphagia 1 (Puree) solids;Thin liquid  Liquid Administration via Cup;Straw  Medication Administration Whole meds with liquid  Compensations Slow rate;Small sips/bites;Follow solids with liquid  Postural Changes Remain semi-upright after after feeds/meals (Comment);Seated upright at 90 degrees      CHL IP OTHER RECOMMENDATIONS 07/12/2021  Recommended Consults Consider GI evaluation;Consider esophageal assessment  Oral Care Recommendations Oral care BID  Other Recommendations --      CHL IP FOLLOW UP RECOMMENDATIONS 07/12/2021  Follow up Recommendations None      No flowsheet data found.         CHL IP ORAL PHASE 07/12/2021  Oral Phase WFL  Oral - Pudding Teaspoon --  Oral - Pudding Cup --  Oral - Honey Teaspoon --  Oral - Honey Cup --  Oral - Nectar Teaspoon --  Oral - Nectar Cup --  Oral - Nectar Straw --  Oral - Thin Teaspoon --  Oral - Thin Cup --  Oral - Thin Straw --  Oral - Puree --  Oral - Mech Soft --  Oral - Regular --  Oral - Multi-Consistency --  Oral - Pill --  Oral Phase - Comment --    CHL IP PHARYNGEAL PHASE  07/12/2021  Pharyngeal Phase WFL  Pharyngeal- Pudding Teaspoon --  Pharyngeal --  Pharyngeal- Pudding Cup --  Pharyngeal --  Pharyngeal- Honey Teaspoon --  Pharyngeal --  Pharyngeal- Honey Cup --  Pharyngeal --  Pharyngeal- Nectar Teaspoon --  Pharyngeal --  Pharyngeal- Nectar Cup --  Pharyngeal --  Pharyngeal- Nectar Straw --  Pharyngeal --  Pharyngeal- Thin Teaspoon --  Pharyngeal --  Pharyngeal- Thin Cup --  Pharyngeal --  Pharyngeal- Thin Straw --  Pharyngeal --  Pharyngeal- Puree --  Pharyngeal --  Pharyngeal- Mechanical Soft --  Pharyngeal --  Pharyngeal- Regular --  Pharyngeal --  Pharyngeal- Multi-consistency --  Pharyngeal --  Pharyngeal- Pill --  Pharyngeal --  Pharyngeal Comment --     CHL IP CERVICAL ESOPHAGEAL PHASE 07/12/2021  Cervical Esophageal Phase WFL  Pudding Teaspoon --  Pudding Cup --  Honey Teaspoon --  Honey Cup --  Nectar Teaspoon --  Nectar Cup --  Nectar Straw --  Thin Teaspoon --  Thin Cup --  Thin Straw --  Puree --  Mechanical Soft --  Regular --  Multi-consistency --  Pill --  Cervical Esophageal Comment --  Aliene Altes 07/12/2021, 2:10 PM                                              Patient will benefit from skilled therapeutic intervention in order to improve the following deficits and impairments:   Pharyngoesophageal dysphagia - Plan: DG SWALLOW FUNC SPEECH PATH, DG SWALLOW [facility] SPEECH PATH        Problem List Patient Active Problem List   Diagnosis Date Noted   Neuroforaminal stenosis of lumbar spine 12/02/2019   Chronic radicular lumbar pain 12/02/2019   Lumbar spondylosis 12/02/2019   Chronic pain syndrome 12/02/2019   Status post total knee replacement using cement, left 06/10/2018   Avascular necrosis of femoral head, left ([place]) 04/28/2018   Vaccine counseling 12/12/2017   Other male erectile dysfunction 07/30/2017   Hyperlipidemia due to type 2 diabetes  mellitus ([place]) 03/28/2017   Class 1 obesity due to excess calories with serious comorbidity and body mass index (BMI) of 33.0 to 33.9 in adult 03/26/2017   Heart murmur, systolic 91/50/5697   Type 2 diabetes mellitus without complication, without long-term current use of insulin ([place]) 03/26/2017   Blunt chest trauma 09/14/2016   HTN (hypertension) 09/14/2016   DM (diabetes mellitus) ([place]) 09/14/2016   Lymphadenopathy 09/14/2016   Urinary retention 09/14/2016   Multiple closed fractures of ribs of left side 09/13/2016   Deneise Lever, [place], CCC-SLP Speech-Language Pathologist  Aliene Altes 07/12/2021, 2:09 PM  [address] [place], [place], [address] Phone: [phone]   Fax:     Name: Ozro Russett MRN: [id number] Date of Birth: 1961-08-24

## 2021-07-13 ENCOUNTER — Ambulatory Visit
Admission: EM | Admit: 2021-07-13 | Discharge: 2021-07-13 | Disposition: A | Discharge status: Home / Self Care | Payer: 59 | Attending: Nurse Practitioner | Admitting: Nurse Practitioner

## 2021-07-13 ENCOUNTER — Encounter: Payer: Self-pay | Admitting: Emergency Medicine

## 2021-07-13 ENCOUNTER — Other Ambulatory Visit: Payer: Self-pay

## 2021-07-13 ENCOUNTER — Ambulatory Visit (INDEPENDENT_AMBULATORY_CARE_PROVIDER_SITE_OTHER): Payer: 59

## 2021-07-13 DIAGNOSIS — M25521 Pain in right elbow: Secondary | ICD-10-CM | POA: Diagnosis not present

## 2021-07-13 DIAGNOSIS — M7021 Olecranon bursitis, right elbow: Secondary | ICD-10-CM | POA: Diagnosis not present

## 2021-07-13 MED ORDER — SULFAMETHOXAZOLE-TRIMETHOPRIM 800-160 MG PO TABS: 1.0000 | ORAL_TABLET | Freq: Two times a day (BID) | ORAL | 0 refills | Status: AC

## 2021-07-13 MED ORDER — NAPROXEN 500 MG PO TABS: 500.0000 mg | ORAL_TABLET | Freq: Two times a day (BID) | ORAL | 0 refills | Status: AC

## 2021-07-13 NOTE — Discharge Instructions (Addendum)
You have bursitis of the right elbow which is also likely to be infected due to the redness, warmth, and tenderness you have of the elbow. Take medications as prescribed. Put ice on your elbow 2-3 times a day for 20 minutes. Keep the ACE wrap around your elbow to protect the joint and provide cushion. I recommend you follow-up with orthopedics before going on your trip on Saturday. [facility] has a walk-in urgent care. Please go there today or tomorrow for re-evaluation.

## 2021-07-13 NOTE — ED Triage Notes (Signed)
Pt c/o right elbow pain. Started yesterday. Denies injury.

## 2021-07-13 NOTE — ED Provider Notes (Signed)
[facility]    CSN: [id number] Arrival date & time: 07/13/21  6761      History   Chief Complaint Chief Complaint  Patient presents with   Elbow Pain    right    HPI Jerry Morgan is a 60 y.o. male.   Subjective:  Romuald Mccaslin is a 60 y.o. male who presents with right elbow pain. Onset of the symptoms was several days ago.  No known inciting event. Current symptoms include warmth, point tenderness, redness, and swelling. Pain is aggravated by direct pressure to elbow. Symptoms have been stable since onset. Patient denies any worsening of symptoms. He has not had any fevers, chills, body aches or drainage. The patient has not had prior elbow problems. No prior evaluation. He has not tried anything for his symptoms.   The following portions of the patient's history were reviewed and updated as appropriate: allergies, current medications, past family history, past medical history, past social history, past surgical history, and problem list.       Past Medical History:  Diagnosis Date   Arthritis    GERD (gastroesophageal reflux disease)    Hypertension    Rib fracture 09/12/2016   after being jerked against a door frame by his son's dog.  Pt sustained Lt rib fractures 5-10 with flail chest segment 5-7/notes 09/13/2016   Type II diabetes mellitus ([place])    dx'd ~ 08/2016    Patient Active Problem List   Diagnosis Date Noted   Neuroforaminal stenosis of lumbar spine 12/02/2019   Chronic radicular lumbar pain 12/02/2019   Lumbar spondylosis 12/02/2019   Chronic pain syndrome 12/02/2019   Status post total knee replacement using cement, left 06/10/2018   Avascular necrosis of femoral head, left ([place]) 04/28/2018   Vaccine counseling 12/12/2017   Other male erectile dysfunction 07/30/2017   Hyperlipidemia due to type 2 diabetes mellitus ([place]) 03/28/2017   Class 1 obesity due to excess calories with serious comorbidity and body mass index (BMI)  of 33.0 to 33.9 in adult 03/26/2017   Heart murmur, systolic 95/06/3266   Type 2 diabetes mellitus without complication, without long-term current use of insulin ([place]) 03/26/2017   Blunt chest trauma 09/14/2016   HTN (hypertension) 09/14/2016   DM (diabetes mellitus) ([place]) 09/14/2016   Lymphadenopathy 09/14/2016   Urinary retention 09/14/2016   Multiple closed fractures of ribs of left side 09/13/2016    Past Surgical History:  Procedure Laterality Date   COLONOSCOPY WITH PROPOFOL N/A 02/09/2016   Procedure: COLONOSCOPY WITH PROPOFOL;  Surgeon: Hulen Luster, MD;  Location: [facility] ENDOSCOPY;  Service: Gastroenterology;  Laterality: N/A;   ESOPHAGOGASTRODUODENOSCOPY (EGD) WITH PROPOFOL N/A 02/09/2016   Procedure: ESOPHAGOGASTRODUODENOSCOPY (EGD) WITH PROPOFOL;  Surgeon: Hulen Luster, MD;  Location: [facility] ENDOSCOPY;  Service: Gastroenterology;  Laterality: N/A;   JOINT REPLACEMENT  2000s   "joints in big toes"    KNEE ARTHROSCOPY Left ~ 2010   toe implant     TONSILLECTOMY     TOTAL KNEE ARTHROPLASTY Left 06/10/2018   Procedure: TOTAL KNEE ARTHROPLASTY;  Surgeon: Hessie Knows, MD;  Location: [facility] ORS;  Service: Orthopedics;  Laterality: Left;       Home Medications    Prior to Admission medications   Medication Sig Start Date End Date Taking? Authorizing Provider  amLODipine (NORVASC) 10 MG tablet Take 10 mg by mouth daily.  02/28/18  Yes [provider]  Cholecalciferol (VITAMIN D3 PO) Take 1 tablet by mouth daily.   Yes  [provider]  Cyanocobalamin (VITAMIN B-12 PO) Take 1 tablet by mouth daily.   Yes [provider]  diphenhydramine-acetaminophen (TYLENOL PM) 25-500 MG TABS tablet Take 2 tablets by mouth at bedtime as needed (for sleep.).   Yes [provider]  Empagliflozin-metFORMIN HCl ER 12.01-999 MG TB24 Take 1 tablet by mouth 2 (two) times daily.  06/24/17  Yes [provider]  naproxen (NAPROSYN) 500 MG tablet Take 1 tablet (500 mg  total) by mouth 2 (two) times daily for 10 days. Take with food 07/13/21 07/23/21 Yes Filippo Puls, Aldona Bar, FNP  pantoprazole (PROTONIX) 40 MG tablet Take 40 mg by mouth daily in the afternoon.    Yes [provider]  rosuvastatin (CRESTOR) 20 MG tablet Take 20 mg by mouth daily.  04/12/18  Yes [provider]  sulfamethoxazole-trimethoprim (BACTRIM DS) 800-160 MG tablet Take 1 tablet by mouth 2 (two) times daily for 10 days. 07/13/21 07/23/21 Yes Enrique Sack, FNP    Family History Family History  Problem Relation Age of Onset   Hypertension Mother    Heart disease Father    Hypertension Father     Social History Social History   Tobacco Use   Smoking status: Never   Smokeless tobacco: Never  Vaping Use   Vaping Use: Never used  Substance Use Topics   Alcohol use: Yes    Alcohol/week: 14.0 standard drinks    Types: 14 Shots of liquor per week    Comment: 2 -3 shots liquor per day   Drug use: Yes    Types: Marijuana     Allergies   Penicillins   Review of Systems Review of Systems  Constitutional:  Negative for fever.  Gastrointestinal:  Negative for nausea and vomiting.  Musculoskeletal:  Positive for arthralgias and joint swelling. Negative for myalgias.  Skin:  Positive for color change.  All other systems reviewed and are negative.   Physical Exam Triage Vital Signs ED Triage Vitals  Enc Vitals Group     BP 07/13/21 0941 122/75     Pulse Rate 07/13/21 0941 68     Resp 07/13/21 0941 18     Temp 07/13/21 0941 98.5 F (36.9 C)     Temp Source 07/13/21 0941 Oral     SpO2 07/13/21 0941 98 %     Weight 07/13/21 0940 210 lb 1.6 oz (95.3 kg)     Height 07/13/21 0940 6' (1.829 m)     Head Circumference --      Peak Flow --      Pain Score 07/13/21 0939 8     Pain Loc --      Pain Edu? --      Excl. in [place]? --    No data found.  Updated Vital Signs BP 122/75 (BP Location: Left Arm)   Pulse 68   Temp 98.5 F (36.9 C) (Oral)   Resp 18    Ht 6' (1.829 m)   Wt 210 lb 1.6 oz (95.3 kg)   SpO2 98%   BMI 28.49 kg/m   Visual Acuity Right Eye Distance:   Left Eye Distance:   Bilateral Distance:    Right Eye Near:   Left Eye Near:    Bilateral Near:     Physical Exam Vitals reviewed.  Constitutional:      General: He is not in acute distress.    Appearance: Normal appearance. He is not ill-appearing, toxic-appearing or diaphoretic.  HENT:     Head: Normocephalic.  Cardiovascular:     Rate and Rhythm: Normal rate and regular rhythm.     Heart sounds: Normal heart sounds.  Pulmonary:     Effort: Pulmonary effort is normal.     Breath sounds: Normal breath sounds.  Musculoskeletal:        General: Normal range of motion.     Right elbow: Swelling and effusion present. No deformity. Normal range of motion. Tenderness present in olecranon process.     Left elbow: Normal.     Cervical back: Normal range of motion and neck supple.  Skin:    General: Skin is warm and dry.  Neurological:     General: No focal deficit present.     Mental Status: He is alert and oriented to person, place, and time.  Psychiatric:        Mood and Affect: Mood normal.        Behavior: Behavior normal.     UC Treatments / Results  Labs (all labs ordered are listed, but only abnormal results are displayed) Labs Reviewed - No data to display  EKG   Radiology DG Elbow Complete Right  Result Date: 07/13/2021 CLINICAL DATA:  Erythema, pain and swelling, no known injury EXAM: RIGHT ELBOW - COMPLETE 3+ VIEW COMPARISON:  None. FINDINGS: There is no evidence of fracture, dislocation, or joint effusion. There is no evidence of arthropathy or other focal bone abnormality. Soft tissue edema overlying the olecranon. IMPRESSION: No fracture or dislocation of the right elbow. No elbow joint effusion. Soft tissue edema overlying the olecranon. Electronically Signed   By: Delanna Ahmadi M.D.   On: 07/13/2021 11:05   DG SWALLOW FUNC SPEECH  PATH  Result Date: 07/12/2021 Table formatting from the original result was not included. ?Objective Swallowing Evaluation: Type of Study: MBS-Modified Barium Swallow Study  Patient Details Name: Tahji Crosby MRN: [id number] Date of Birth: [birth year]   Today's Date: 07/12/2021 Time: SLP Start Time (ACUTE ONLY): 1300 -SLP Stop Time (ACUTE ONLY): 1320   SLP Time Calculation (min) (ACUTE ONLY): 20 min     Past Medical History:    Past Medical History: Diagnosis Date  Arthritis    GERD (gastroesophageal reflux disease)    Hypertension    Rib fracture 09/12/2016   after being jerked against a door frame by his son's dog.  Pt sustained Lt rib fractures 5-10 with flail chest segment 5-7/notes 09/13/2016  Type II diabetes mellitus ([place])     dx'd ~ 08/2016   Past Surgical History:     Past Surgical History: Procedure Laterality Date  COLONOSCOPY WITH PROPOFOL N/A 02/09/2016   Procedure: COLONOSCOPY WITH PROPOFOL;  Surgeon: Hulen Luster, MD;  Location: [facility] ENDOSCOPY;  Service: Gastroenterology;  Laterality: N/A;  ESOPHAGOGASTRODUODENOSCOPY (EGD) WITH PROPOFOL N/A 02/09/2016   Procedure: ESOPHAGOGASTRODUODENOSCOPY (EGD) WITH PROPOFOL;  Surgeon: Hulen Luster, MD;  Location: [facility] ENDOSCOPY;  Service: Gastroenterology;  Laterality: N/A;  JOINT REPLACEMENT   2000s   "joints in big toes"   KNEE ARTHROSCOPY Left ~ 2010  toe implant      TONSILLECTOMY      TOTAL KNEE ARTHROPLASTY Left 06/10/2018   Procedure: TOTAL KNEE ARTHROPLASTY;  Surgeon: Hessie Knows, MD;  Location: [facility] ORS;  Service: Orthopedics;  Laterality: Left;   HPI: Patient is a 60 y.o. male with past medical history of HTN, HLD, DM2, anemia, obesity referred for MBS due to difficulty swallowing 5+months, reporting 10 lb unintentional weight loss.  Subjective: pleasant, cooperative  Assessment / Plan / Recommendation   CHL IP CLINICAL IMPRESSIONS 07/12/2021 Clinical Impression Patient presents with functional oropharyngeal swallow. Oral stage is characterized by  adequate lip closure, bolus preparation, and anterior to posterior transit. Swallow initiation is timely at the level of the base of tongue. Pharyngeal stage is noted for adequate tongue base retraction, hyolaryngeal excursion, and pharyngeal constriction. Epiglottic deflection is complete; there is no penetration or aspiration. Pharyngeal stripping wave is complete with no abnormal pharyngeal residue. With solid, pt reported feeling it was "difficult" to swallow and was noted to thrust head forward and down as bolus passed the laryngeal vestibule. There was no residue or obstruction of flow correlating with pt's sensation. Amplitude/duration of cricopharyngeus opening is WFL. There is adequate/complete clearance through the cervical esophagus. A limited esophageal sweep was performed in the upright position with mechanical soft solid and was unremarkable. Consistencies tested were thin liquids x2 tsps, 1cup sip, 3 sequential boluses, nectar x1 tsp, 1 cup sip, 3 sequential boluses, puree x1 heaping tsps, mechanical soft (1/4 graham cracker in applesauce x2). Patient reports difficulty eating solids, with instantaneous gagging/regurgitation. Encouraged follow-up with GI (appt next month), as pt's symptoms may indicate esophageal dysphagia. Manometry may be beneficial for assessment of esophageal pressures given pt's attempt to exert additional force on solid boluses. SLP educated on reflux modifications and provided handout with this information. Recommend patient continue regular diet with thin liquids; no further ST indicated at this time. SLP Visit Diagnosis Dysphagia, pharyngoesophageal phase (R13.14)  Deneise Lever, MS, CCC-SLP Speech-Language Pathologist    Procedures Procedures (including critical care time)  Medications Ordered in UC Medications - No data to display  Initial Impression / Assessment and Plan / UC Course  I have reviewed the triage vital signs and the nursing notes.  Pertinent  labs & imaging results that were available during my care of the patient were reviewed by me and considered in my medical decision making (see chart for details).    60 yo male presenting with right elbow erythema, warmth, pain, swelling, and fullness for the past couple of days. Pain is aggravated by direct pressure to the elbow. He denies any fevers or drainage. No prior history of this or elbow problems. He has not tried anything for his symptoms. X-ray right elbow shows no fracture, dislocation or effusion of the right elbow.   Plan:  Rest, ice, compression, and elevation (RICE) therapy. Elbow placed in ACE wrap for joint protection  Antibiotics and NSAIDs per orders  Plain film x-rays per orders. Orthopedics referral ASAP, information for [facility] urgent care given. Patient is traveling to [place] for a couple of weeks ina couple of days. Advised patient to follow-up with ortho prior to leaving for trip.   Today's evaluation has revealed no signs of a dangerous process. Discussed diagnosis with patient and/or guardian. Patient and/or guardian aware of their diagnosis, possible red flag symptoms to watch out for and need for close follow up. Patient and/or guardian understands verbal and written discharge instructions. Patient and/or guardian comfortable with plan and disposition.  Patient and/or guardian has a clear mental status at this time, good insight into illness (after discussion and teaching) and has clear judgment to make decisions regarding their care  This care was provided during an unprecedented National Emergency due to the Novel Coronavirus (COVID-19) pandemic. COVID-19 infections and transmission risks place heavy strains on healthcare resources.  As this pandemic evolves, our facility, providers, and staff strive to respond fluidly, to remain  operational, and to provide care relative to available resources and information. Outcomes are unpredictable and treatments are without  well-defined guidelines. Further, the impact of COVID-19 on all aspects of urgent care, including the impact to patients seeking care for reasons other than COVID-19, is unavoidable during this national emergency. At this time of the global pandemic, management of patients has significantly changed, even for non-COVID positive patients given high local and regional COVID volumes at this time requiring high healthcare system and resource utilization. The standard of care for management of both COVID suspected and non-COVID suspected patients continues to change rapidly at the local, regional, national, and global levels. This patient was worked up and treated to the best available but ever changing evidence and resources available at this current time.   Documentation was completed with the aid of voice recognition software. Transcription may contain typographical errors.  Final Clinical Impressions(s) / UC Diagnoses   Final diagnoses:  Olecranon bursitis of right elbow     Discharge Instructions      You have bursitis of the right elbow which is also likely to be infected due to the redness, warmth, and tenderness you have of the elbow. Take medications as prescribed. Put ice on your elbow 2-3 times a day for 20 minutes. Keep the ACE wrap around your elbow to protect the joint and provide cushion. I recommend you follow-up with orthopedics before going on your trip on Saturday. [facility] has a walk-in urgent care. Please go there today or tomorrow for re-evaluation.      ED Prescriptions     Medication Sig Dispense Auth. Provider   sulfamethoxazole-trimethoprim (BACTRIM DS) 800-160 MG tablet Take 1 tablet by mouth 2 (two) times daily for 10 days. 20 tablet Enrique Sack, FNP   naproxen (NAPROSYN) 500 MG tablet Take 1 tablet (500 mg total) by mouth 2 (two) times daily for 10 days. Take with food 20 tablet Enrique Sack, FNP      PDMP not reviewed this encounter.   Enrique Sack, [place] 07/13/21 1141

## 2021-08-18 ENCOUNTER — Encounter: Payer: Self-pay | Admitting: *Deleted

## 2021-08-21 ENCOUNTER — Encounter
Admission: RE | Disposition: A | Discharge status: Home / Self Care | Payer: Self-pay | Source: Ambulatory Visit | Attending: Gastroenterology

## 2021-08-21 ENCOUNTER — Ambulatory Visit
Admission: RE | Admit: 2021-08-21 | Discharge: 2021-08-21 | Disposition: A | Discharge status: Home / Self Care | Payer: 59 | Source: Ambulatory Visit | Attending: Gastroenterology | Admitting: Gastroenterology

## 2021-08-21 ENCOUNTER — Other Ambulatory Visit: Payer: Self-pay

## 2021-08-21 ENCOUNTER — Ambulatory Visit: Payer: 59 | Admitting: Certified Registered Nurse Anesthetist

## 2021-08-21 ENCOUNTER — Encounter: Payer: Self-pay | Admitting: *Deleted

## 2021-08-21 DIAGNOSIS — Z1211 Encounter for screening for malignant neoplasm of colon: Secondary | ICD-10-CM | POA: Diagnosis present

## 2021-08-21 DIAGNOSIS — K640 First degree hemorrhoids: Secondary | ICD-10-CM | POA: Diagnosis not present

## 2021-08-21 DIAGNOSIS — R1310 Dysphagia, unspecified: Secondary | ICD-10-CM | POA: Insufficient documentation

## 2021-08-21 DIAGNOSIS — K2289 Other specified disease of esophagus: Secondary | ICD-10-CM | POA: Insufficient documentation

## 2021-08-21 DIAGNOSIS — K2100 Gastro-esophageal reflux disease with esophagitis, without bleeding: Secondary | ICD-10-CM | POA: Diagnosis not present

## 2021-08-21 HISTORY — PX: COLONOSCOPY WITH PROPOFOL: SHX5780

## 2021-08-21 HISTORY — PX: ESOPHAGOGASTRODUODENOSCOPY (EGD) WITH PROPOFOL: SHX5813

## 2021-08-21 LAB — KOH PREP: Special Requests: NORMAL

## 2021-08-21 LAB — GLUCOSE, CAPILLARY: Glucose-Capillary: 126 mg/dL — ABNORMAL HIGH (ref 70–99)

## 2021-08-21 SURGERY — ESOPHAGOGASTRODUODENOSCOPY (EGD) WITH PROPOFOL
Anesthesia: General

## 2021-08-21 MED ORDER — PROPOFOL 500 MG/50ML IV EMUL
INTRAVENOUS | Status: DC | PRN
  Administered 2021-08-21: 160 ug/kg/min via INTRAVENOUS

## 2021-08-21 MED ORDER — SODIUM CHLORIDE 0.9 % IV SOLN: INTRAVENOUS | Status: DC

## 2021-08-21 MED ORDER — LIDOCAINE HCL (CARDIAC) PF 100 MG/5ML IV SOSY
PREFILLED_SYRINGE | INTRAVENOUS | Status: DC | PRN
Start: 2021-08-21 — End: 2021-08-21
  Administered 2021-08-21: 50 mg via INTRAVENOUS

## 2021-08-21 MED ORDER — PROPOFOL 10 MG/ML IV BOLUS
INTRAVENOUS | Status: DC | PRN
  Administered 2021-08-21: 10 mg via INTRAVENOUS
  Administered 2021-08-21: 60 mg via INTRAVENOUS
  Administered 2021-08-21: 30 mg via INTRAVENOUS
  Administered 2021-08-21: 20 mg via INTRAVENOUS

## 2021-08-21 NOTE — Anesthesia Preprocedure Evaluation (Signed)
Anesthesia Evaluation  Patient identified by MRN, date of birth, ID band Patient awake    Reviewed: Allergy & Precautions, H&P , NPO status , Patient's Chart, lab work & pertinent test results  Airway Mallampati: II  TM Distance: >3 FB Neck ROM: Full    Dental  (+) Poor Dentition, Chipped,    Pulmonary neg pulmonary ROS,    Pulmonary exam normal        Cardiovascular hypertension, Pt. on medications Normal cardiovascular exam+ Valvular Problems/Murmurs AS      Neuro/Psych negative neurological ROS  negative psych ROS   GI/Hepatic Neg liver ROS, Bowel prep,GERD  Medicated,  Endo/Other  negative endocrine ROSdiabetes  Renal/GU negative Renal ROS  negative genitourinary   Musculoskeletal  (+) Arthritis , Osteoarthritis,    Abdominal   Peds negative pediatric ROS (+)  Hematology negative hematology ROS (+)   Anesthesia Other Findings . Arthritis  knees/back  . Chickenpox  . Diabetes mellitus type 2, uncomplicated (CMS-HCC)  . Gastritis 02/09/2016  . GERD (gastroesophageal reflux disease)  . Hyperplastic colon polyp 02/09/2016  . Hypertension  ECHO 05/13/20 1. The left ventricle is normal in size with mildly increased wall  thickness.   2. The left ventricular systolic function is normal, LVEF is visually  estimated at 60-65%.   3. The right ventricle is normal in size, with normal systolic function.   4. There is mild aortic valve stenosis.   5. Pulmonary systolic pressure cannot be estimated due to insufficientTR  jet.      Reproductive/Obstetrics negative OB ROS                             Anesthesia Physical Anesthesia Plan  ASA: 3  Anesthesia Plan: General   Post-op Pain Management:    Induction: Intravenous  PONV Risk Score and Plan: 2 and Propofol infusion and TIVA  Airway Management Planned: Natural Airway and Nasal Cannula  Additional Equipment:   Intra-op  Plan:   Post-operative Plan:   Informed Consent: I have reviewed the patients History and Physical, chart, labs and discussed the procedure including the risks, benefits and alternatives for the proposed anesthesia with the patient or authorized representative who has indicated his/her understanding and acceptance.       Plan Discussed with: CRNA, Anesthesiologist and Surgeon  Anesthesia Plan Comments:         Anesthesia Quick Evaluation

## 2021-08-21 NOTE — H&P (Signed)
Outpatient short stay form Pre-procedure 08/21/2021  Lesly Rubenstein, MD  Primary Physician: Dion Body, MD  Reason for visit:  Dysphagia/History of polyps  History of present illness:   60 y/o gentleman with history of hypertension and HLD here for EGD/Colonoscopy for solid food dysphagia that has been going on for 6 months. He is only able to tolerate soups mostly. No smoking but does drink wine daily. No family history of GI malignancies. No neck surgeries. No blood thinners. Last colonoscopy was 5 years ago with hyperplastic polyps but has history of adenomatous polyps on a previous colonoscopy, not sure when.    Current Facility-Administered Medications:    0.9 %  sodium chloride infusion, , Intravenous, Continuous, Doral Ventrella, Hilton Cork, MD, Last Rate: 20 mL/hr at 08/21/21 8099, New Bag at 08/21/21 0822  Medications Prior to Admission  Medication Sig Dispense Refill Last Dose   gabapentin (NEURONTIN) 300 MG capsule Take 300 mg by mouth at bedtime.   08/19/2021   losartan (COZAAR) 100 MG tablet Take 100 mg by mouth daily.   08/19/2021   amLODipine (NORVASC) 10 MG tablet Take 10 mg by mouth daily.    08/19/2021   aspirin EC 81 MG tablet Take 81 mg by mouth daily. Swallow whole. (Patient not taking: Reported on 08/21/2021)   Not Taking   Cholecalciferol (VITAMIN D3 PO) Take 1 tablet by mouth daily.   08/19/2021   Cyanocobalamin (VITAMIN B-12 PO) Take 1 tablet by mouth daily.   08/19/2021   diphenhydramine-acetaminophen (TYLENOL PM) 25-500 MG TABS tablet Take 2 tablets by mouth at bedtime as needed (for sleep.).   08/19/2021   Empagliflozin-metFORMIN HCl ER 12.01-999 MG TB24 Take 1 tablet by mouth 2 (two) times daily.    08/19/2021   oxyCODONE (OXY IR/ROXICODONE) 5 MG immediate release tablet Take 5 mg by mouth every 3 (three) hours as needed for severe pain (Gradually taper down). (Patient not taking: Reported on 08/21/2021)   Not Taking   pantoprazole (PROTONIX) 40 MG tablet  Take 40 mg by mouth 2 (two) times daily.   08/19/2021   polyethylene glycol (MIRALAX / GLYCOLAX) 17 g packet Take 17 g by mouth 2 (two) times daily. Mix in 4-8oz fluid (Patient not taking: Reported on 08/21/2021)   Not Taking   rosuvastatin (CRESTOR) 20 MG tablet Take 20 mg by mouth daily.    08/19/2021   sennosides-docusate sodium (SENOKOT-S) 8.6-50 MG tablet Take 2 tablets by mouth at bedtime. (Patient not taking: Reported on 08/21/2021)   Not Taking     Allergies  Allergen Reactions   Penicillins Hives and Other (See Comments)    Has patient had a PCN reaction causing immediate rash, facial/tongue/throat swelling, SOB or lightheadedness with hypotension: yes Has patient had a PCN reaction causing severe rash involving mucus membranes or skin necrosis: no Has patient had a PCN reaction that required hospitalization no Has patient had a PCN reaction occurring within the last 10 years: no If all of the above answers are "NO", then may proceed with Cephalosporin use.       Past Medical History:  Diagnosis Date   Arthritis    GERD (gastroesophageal reflux disease)    Hypertension    Rib fracture 09/12/2016   after being jerked against a door frame by his son's dog.  Pt sustained Lt rib fractures 5-10 with flail chest segment 5-7/notes 09/13/2016   Type II diabetes mellitus ([place])    dx'd ~ 08/2016    Review of systems:  Otherwise  negative.    Physical Exam  Gen: Alert, oriented. Appears stated age.  HEENT: PERRLA. Lungs: No respiratory distress CV: RRR Abd: soft, benign, no masses Ext: No edema    Planned procedures: Proceed with EGD/colonoscopy. The patient understands the nature of the planned procedure, indications, risks, alternatives and potential complications including but not limited to bleeding, infection, perforation, damage to internal organs and possible oversedation/side effects from anesthesia. The patient agrees and gives consent to proceed.  Please refer to  procedure notes for findings, recommendations and patient disposition/instructions.     Lesly Rubenstein, MD [facility] Gastroenterology

## 2021-08-21 NOTE — Interval H&P Note (Signed)
History and Physical Interval Note:  08/21/2021 9:12 AM  Jerry Morgan  has presented today for surgery, with the diagnosis of Dysphagia and GERD H/O Polyps.  The various methods of treatment have been discussed with the patient and family. After consideration of risks, benefits and other options for treatment, the patient has consented to  Procedure(s): ESOPHAGOGASTRODUODENOSCOPY (EGD) WITH PROPOFOL (N/A) COLONOSCOPY WITH PROPOFOL (N/A) as a surgical intervention.  The patient's history has been reviewed, patient examined, no change in status, stable for surgery.  I have reviewed the patient's chart and labs.  Questions were answered to the patient's satisfaction.     Lesly Rubenstein  Ok to proceed with EGD/Colonoscopy

## 2021-08-21 NOTE — Op Note (Signed)
[facility] Gastroenterology Patient Name: Jerry Morgan Procedure Date: 08/21/2021 9:08 AM MRN: [id number] Account #: [id number] Date of Birth: 1961-09-26 Admit Type: Outpatient Age: 60 Room: [facility] ENDO ROOM 3 Gender: Male Note Status: Finalized Instrument Name: Jasper Riling [id number] Procedure:             Colonoscopy Indications:           Surveillance: Personal history of adenomatous polyps                         on last colonoscopy > 5 years ago Providers:             Andrey Farmer MD, MD Medicines:             Monitored Anesthesia Care Complications:         No immediate complications. Procedure:             Pre-Anesthesia Assessment:                        - Prior to the procedure, a History and Physical was                         performed, and patient medications and allergies were                         reviewed. The patient is competent. The risks and                         benefits of the procedure and the sedation options and                         risks were discussed with the patient. All questions                         were answered and informed consent was obtained.                         Patient identification and proposed procedure were                         verified by the physician, the nurse, the anesthetist                         and the technician in the endoscopy suite. Mental                         Status Examination: alert and oriented. Airway                         Examination: normal oropharyngeal airway and neck                         mobility. Respiratory Examination: clear to                         auscultation. CV Examination: normal. Prophylactic                         Antibiotics: The patient does not require prophylactic  antibiotics. Prior Anticoagulants: The patient has                         taken no previous anticoagulant or antiplatelet                         agents. ASA Grade  Assessment: III - A patient with                         severe systemic disease. After reviewing the risks and                         benefits, the patient was deemed in satisfactory                         condition to undergo the procedure. The anesthesia                         plan was to use monitored anesthesia care (MAC).                         Immediately prior to administration of medications,                         the patient was re-assessed for adequacy to receive                         sedatives. The heart rate, respiratory rate, oxygen                         saturations, blood pressure, adequacy of pulmonary                         ventilation, and response to care were monitored                         throughout the procedure. The physical status of the                         patient was re-assessed after the procedure.                        After obtaining informed consent, the colonoscope was                         passed under direct vision. Throughout the procedure,                         the patient's blood pressure, pulse, and oxygen                         saturations were monitored continuously. The                         Colonoscope was introduced through the anus and                         advanced to the the cecum, identified by appendiceal  orifice and ileocecal valve. The colonoscopy was                         performed without difficulty. The patient tolerated                         the procedure well. The quality of the bowel                         preparation was fair. Findings:      The perianal and digital rectal examinations were normal.      Internal hemorrhoids were found during retroflexion. The hemorrhoids       were Grade I (internal hemorrhoids that do not prolapse).      The exam was otherwise without abnormality on direct and retroflexion       views. Impression:            - Preparation of the colon was  fair.                        - Internal hemorrhoids.                        - The examination was otherwise normal on direct and                         retroflexion views.                        - No specimens collected. Recommendation:        - Discharge patient to home.                        - Resume previous diet.                        - Continue present medications.                        - Repeat colonoscopy in 1 year because the bowel                         preparation was suboptimal.                        - Return to referring physician as previously                         scheduled. Procedure Code(s):     --- Professional ---                        I7579, Colorectal cancer screening; colonoscopy on                         individual at high risk Diagnosis Code(s):     --- Professional ---                        Z86.010, Personal history of colonic polyps                        K64.0,  First degree hemorrhoids CPT copyright 2019 American Medical Association. All rights reserved. The codes documented in this report are preliminary and upon coder review may  be revised to meet current compliance requirements. Andrey Farmer MD, MD 08/21/2021 9:46:48 AM Number of Addenda: 0 Note Initiated On: 08/21/2021 9:08 AM Scope Withdrawal Time: 0 hours 6 minutes 53 seconds  Total Procedure Duration: 0 hours 10 minutes 41 seconds  Estimated Blood Loss:  Estimated blood loss: none.      [facility]

## 2021-08-21 NOTE — Transfer of Care (Signed)
Immediate Anesthesia Transfer of Care Note  Patient: Jerry Morgan  Procedure(s) Performed: ESOPHAGOGASTRODUODENOSCOPY (EGD) WITH PROPOFOL COLONOSCOPY WITH PROPOFOL  Patient Location: Endoscopy Unit  Anesthesia Type:General  Level of Consciousness: drowsy  Airway & Oxygen Therapy: Patient Spontanous Breathing  Post-op Assessment: Report given to RN and Post -op Vital signs reviewed and stable  Post vital signs: Reviewed and stable  Last Vitals:  Vitals Value Taken Time  BP 120/74 08/21/21 0942  Temp 36.4 C 08/21/21 0941  Pulse 65 08/21/21 0943  Resp 14 08/21/21 0943  SpO2 91 % 08/21/21 0943  Vitals shown include unvalidated device data.  Last Pain:  Vitals:   08/21/21 0941  TempSrc: Temporal  PainSc: Asleep         Complications: No notable events documented.

## 2021-08-21 NOTE — Anesthesia Postprocedure Evaluation (Signed)
Anesthesia Post Note  Patient: Jerry Morgan  Procedure(s) Performed: ESOPHAGOGASTRODUODENOSCOPY (EGD) WITH PROPOFOL COLONOSCOPY WITH PROPOFOL  Patient location during evaluation: Phase II Anesthesia Type: General Level of consciousness: awake and alert, awake and oriented Pain management: pain level controlled Vital Signs Assessment: post-procedure vital signs reviewed and stable Respiratory status: spontaneous breathing, nonlabored ventilation and respiratory function stable Cardiovascular status: blood pressure returned to baseline and stable Postop Assessment: no apparent nausea or vomiting Anesthetic complications: no   No notable events documented.   Last Vitals:  Vitals:   08/21/21 0941 08/21/21 1001  BP: 120/74 (!) 148/83  Pulse: 67   Resp: 13   Temp: (!) 36.4 C   SpO2: 92% 98%    Last Pain:  Vitals:   08/21/21 1001  TempSrc:   PainSc: 0-No pain                 Phill Mutter

## 2021-08-21 NOTE — Op Note (Signed)
[facility] Gastroenterology Patient Name: Jerry Morgan Procedure Date: 08/21/2021 9:08 AM MRN: [id number] Account #: [id number] Date of Birth: October 08, 1960 Admit Type: Outpatient Age: 60 Room: [facility] ENDO ROOM 3 Gender: Male Note Status: Finalized Instrument Name: Michaelle Birks [id number] Procedure:             Upper GI endoscopy Indications:           Dysphagia Providers:             Andrey Farmer MD, MD Medicines:             Monitored Anesthesia Care Complications:         No immediate complications. Estimated blood loss:                         Minimal. Procedure:             Pre-Anesthesia Assessment:                        - Prior to the procedure, a History and Physical was                         performed, and patient medications and allergies were                         reviewed. The patient is competent. The risks and                         benefits of the procedure and the sedation options and                         risks were discussed with the patient. All questions                         were answered and informed consent was obtained.                         Patient identification and proposed procedure were                         verified by the physician, the nurse, the anesthetist                         and the technician in the endoscopy suite. Mental                         Status Examination: alert and oriented. Airway                         Examination: normal oropharyngeal airway and neck                         mobility. Respiratory Examination: clear to                         auscultation. CV Examination: normal. Prophylactic                         Antibiotics: The patient does not require prophylactic  antibiotics. Prior Anticoagulants: The patient has                         taken no previous anticoagulant or antiplatelet                         agents. ASA Grade Assessment: III - A patient with                          severe systemic disease. After reviewing the risks and                         benefits, the patient was deemed in satisfactory                         condition to undergo the procedure. The anesthesia                         plan was to use monitored anesthesia care (MAC).                         Immediately prior to administration of medications,                         the patient was re-assessed for adequacy to receive                         sedatives. The heart rate, respiratory rate, oxygen                         saturations, blood pressure, adequacy of pulmonary                         ventilation, and response to care were monitored                         throughout the procedure. The physical status of the                         patient was re-assessed after the procedure.                        After obtaining informed consent, the endoscope was                         passed under direct vision. Throughout the procedure,                         the patient's blood pressure, pulse, and oxygen                         saturations were monitored continuously. The Endoscope                         was introduced through the mouth, and advanced to the                         second part of duodenum. The upper GI endoscopy was  accomplished without difficulty. The patient tolerated                         the procedure well. Findings:      White nummular lesions were noted in the entire esophagus. Brushings for       KOH prep were obtained. Estimated blood loss: none. Biopsies were       obtained from the proximal and distal esophagus with cold forceps for       histology of suspected eosinophilic esophagitis. Estimated blood loss       was minimal.      The exam of the esophagus was otherwise normal.      The entire examined stomach was normal.      The examined duodenum was normal. Impression:            - White nummular lesions in  esophageal mucosa.                         Brushings performed. Biopsied.                        - Normal stomach.                        - Normal examined duodenum. Recommendation:        - Await pathology results.                        - Perform a colonoscopy today. Procedure Code(s):     --- Professional ---                        [phone], Esophagogastroduodenoscopy, flexible,                         transoral; with biopsy, single or multiple Diagnosis Code(s):     --- Professional ---                        K22.8, Other specified diseases of esophagus                        R13.10, Dysphagia, unspecified CPT copyright 2019 American Medical Association. All rights reserved. The codes documented in this report are preliminary and upon coder review may  be revised to meet current compliance requirements. Andrey Farmer MD, MD 08/21/2021 9:43:31 AM Number of Addenda: 0 Note Initiated On: 08/21/2021 9:08 AM Estimated Blood Loss:  Estimated blood loss was minimal.      [facility]

## 2021-08-22 ENCOUNTER — Encounter: Payer: Self-pay | Admitting: Gastroenterology

## 2021-08-22 LAB — SURGICAL PATHOLOGY

## 2023-06-30 ENCOUNTER — Ambulatory Visit
Admission: RE | Admit: 2023-06-30 | Discharge: 2023-06-30 | Disposition: A | Discharge status: Home / Self Care | Payer: 59 | Source: Ambulatory Visit

## 2023-06-30 VITALS — BP 102/66 | HR 89 | Temp 97.6°F | Resp 15 | Ht 70.5 in | Wt 200.0 lb

## 2023-06-30 DIAGNOSIS — M436 Torticollis: Secondary | ICD-10-CM | POA: Diagnosis not present

## 2023-06-30 DIAGNOSIS — M542 Cervicalgia: Secondary | ICD-10-CM | POA: Diagnosis not present

## 2023-06-30 MED ORDER — CYCLOBENZAPRINE HCL 5 MG PO TABS: 5.0000 mg | ORAL_TABLET | Freq: Three times a day (TID) | ORAL | 0 refills | Status: AC | PRN

## 2023-06-30 NOTE — ED Provider Notes (Signed)
[facility]    CSN: [id number] Arrival date & time: 06/30/23  8295      History   Chief Complaint Chief Complaint  Patient presents with   Back Pain    Appointment    HPI Jerry Morgan is a 62 y.o. male.   62 year old male patient Jerry Morgan, presents to urgent care for evaluation of upper back and neck pain that started Thursday morning.  Patient states that the pain has got worse and he cannot move or turn his head from side-to-side due to the pain.  Patient denies any fall, injury or trauma.  Patient states he has had this issue before and muscle relaxer was helpful.  The history is provided by the patient. No language interpreter was used.    Past Medical History:  Diagnosis Date   Arthritis    GERD (gastroesophageal reflux disease)    Hypertension    Rib fracture 09/12/2016   after being jerked against a door frame by his son's dog.  Pt sustained Lt rib fractures 5-10 with flail chest segment 5-7/notes 09/13/2016   Type II diabetes mellitus (HCC)    dx'd ~ 08/2016    Patient Active Problem List   Diagnosis Date Noted   Torticollis 06/30/2023   Neck pain 06/30/2023   Neuroforaminal stenosis of lumbar spine 12/02/2019   Chronic radicular lumbar pain 12/02/2019   Lumbar spondylosis 12/02/2019   Chronic pain syndrome 12/02/2019   Status post total knee replacement using cement, left 06/10/2018   Avascular necrosis of femoral head, left (HCC) 04/28/2018   Vaccine counseling 12/12/2017   Other male erectile dysfunction 07/30/2017   Hyperlipidemia due to type 2 diabetes mellitus (HCC) 03/28/2017   Class 1 obesity due to excess calories with serious comorbidity and body mass index (BMI) of 33.0 to 33.9 in adult 03/26/2017   Heart murmur, systolic 03/26/2017   Type 2 diabetes mellitus without complication, without long-term current use of insulin (HCC) 03/26/2017   Blunt chest trauma 09/14/2016   HTN (hypertension) 09/14/2016   DM (diabetes  mellitus) (HCC) 09/14/2016   Lymphadenopathy 09/14/2016   Urinary retention 09/14/2016   Multiple closed fractures of ribs of left side 09/13/2016    Past Surgical History:  Procedure Laterality Date   BACK SURGERY     Laminectomy   COLONOSCOPY WITH PROPOFOL N/A 02/09/2016   Procedure: COLONOSCOPY WITH PROPOFOL;  Surgeon: Wallace Cullens, MD;  Location: [facility] ENDOSCOPY;  Service: Gastroenterology;  Laterality: N/A;   COLONOSCOPY WITH PROPOFOL N/A 08/21/2021   Procedure: COLONOSCOPY WITH PROPOFOL;  Surgeon: Regis Bill, MD;  Location: [facility] ENDOSCOPY;  Service: Endoscopy;  Laterality: N/A;   ESOPHAGOGASTRODUODENOSCOPY (EGD) WITH PROPOFOL N/A 02/09/2016   Procedure: ESOPHAGOGASTRODUODENOSCOPY (EGD) WITH PROPOFOL;  Surgeon: Wallace Cullens, MD;  Location: [facility] ENDOSCOPY;  Service: Gastroenterology;  Laterality: N/A;   ESOPHAGOGASTRODUODENOSCOPY (EGD) WITH PROPOFOL N/A 08/21/2021   Procedure: ESOPHAGOGASTRODUODENOSCOPY (EGD) WITH PROPOFOL;  Surgeon: Regis Bill, MD;  Location: [facility] ENDOSCOPY;  Service: Endoscopy;  Laterality: N/A;   JOINT REPLACEMENT  2000s   "joints in big toes"    KNEE ARTHROSCOPY Left ~ 2010   toe implant     TONSILLECTOMY     TOTAL KNEE ARTHROPLASTY Left 06/10/2018   Procedure: TOTAL KNEE ARTHROPLASTY;  Surgeon: Kennedy Bucker, MD;  Location: [facility] ORS;  Service: Orthopedics;  Laterality: Left;       Home Medications    Prior to Admission medications   Medication Sig Start Date End Date Taking? Authorizing Provider  amLODipine (NORVASC) 10 MG tablet Take 10 mg by mouth daily.  02/28/18  Yes [provider]  cyclobenzaprine (FLEXERIL) 5 MG tablet Take 1 tablet (5 mg total) by mouth 3 (three) times daily as needed for up to 5 days for muscle spasms. 06/30/23 07/05/23 Yes Geraldin Habermehl, Para March, NP  Empagliflozin-metFORMIN HCl ER 12.01-999 MG TB24 Take 1 tablet by mouth 2 (two) times daily.  06/24/17  Yes [provider]  pantoprazole (PROTONIX) 40 MG  tablet Take 1 tablet by mouth daily. 03/25/23  Yes [provider]  rosuvastatin (CRESTOR) 20 MG tablet Take 1 tablet by mouth daily. 08/27/22  Yes [provider]  solifenacin (VESICARE) 10 MG tablet Take 1 tablet by mouth daily. 12/06/22  Yes [provider]  aspirin EC 81 MG tablet Take 81 mg by mouth daily. Swallow whole. Patient not taking: Reported on 08/21/2021    [provider]  Cholecalciferol (VITAMIN D3 PO) Take 1 tablet by mouth daily.    [provider]  Cyanocobalamin (VITAMIN B-12 PO) Take 1 tablet by mouth daily.    [provider]  diphenhydramine-acetaminophen (TYLENOL PM) 25-500 MG TABS tablet Take 2 tablets by mouth at bedtime as needed (for sleep.).    [provider]  gabapentin (NEURONTIN) 300 MG capsule Take 300 mg by mouth at bedtime.    [provider]  losartan (COZAAR) 100 MG tablet Take 100 mg by mouth daily.    [provider]  oxyCODONE (OXY IR/ROXICODONE) 5 MG immediate release tablet Take 5 mg by mouth every 3 (three) hours as needed for severe pain (Gradually taper down). Patient not taking: Reported on 08/21/2021    [provider]  pantoprazole (PROTONIX) 40 MG tablet Take 40 mg by mouth 2 (two) times daily.    [provider]  polyethylene glycol (MIRALAX / GLYCOLAX) 17 g packet Take 17 g by mouth 2 (two) times daily. Mix in 4-8oz fluid Patient not taking: Reported on 08/21/2021    [provider]  rosuvastatin (CRESTOR) 20 MG tablet Take 20 mg by mouth daily.  04/12/18   [provider]  sennosides-docusate sodium (SENOKOT-S) 8.6-50 MG tablet Take 2 tablets by mouth at bedtime. Patient not taking: Reported on 08/21/2021    [provider]    Family History Family History  Problem Relation Age of Onset   Hypertension Mother    Heart disease Father    Hypertension Father     Social History Social History   Tobacco Use   Smoking  status: Never   Smokeless tobacco: Never  Vaping Use   Vaping status: Never Used  Substance Use Topics   Alcohol use: Yes    Alcohol/week: 14.0 standard drinks of alcohol    Types: 14 Shots of liquor per week    Comment: 2 -3 shots liquor per day, last drink saturday   Drug use: Yes    Types: Marijuana    Comment: last had 2 weeks     Allergies   Penicillins   Review of Systems Review of Systems  Constitutional:  Negative for fever.  Musculoskeletal:  Positive for myalgias, neck pain and neck stiffness.  Skin:  Negative for rash.  All other systems reviewed and are negative.    Physical Exam Triage Vital Signs ED Triage Vitals  Encounter Vitals Group     BP 06/30/23 0943 102/66     Systolic BP Percentile --      Diastolic BP Percentile --  Pulse Rate 06/30/23 0943 89     Resp 06/30/23 0943 15     Temp 06/30/23 0943 97.6 F (36.4 C)     Temp Source 06/30/23 0943 Oral     SpO2 06/30/23 0943 96 %     Weight 06/30/23 0940 200 lb (90.7 kg)     Height 06/30/23 0940 5' 10.5" (1.791 m)     Head Circumference --      Peak Flow --      Pain Score 06/30/23 0939 8     Pain Loc --      Pain Education --      Exclude from Growth Chart --    No data found.  Updated Vital Signs BP 102/66 (BP Location: Right Arm)   Pulse 89   Temp 97.6 F (36.4 C) (Oral)   Resp 15   Ht 5' 10.5" (1.791 m)   Wt 200 lb (90.7 kg)   SpO2 96%   BMI 28.29 kg/m   Visual Acuity Right Eye Distance:   Left Eye Distance:   Bilateral Distance:    Right Eye Near:   Left Eye Near:    Bilateral Near:     Physical Exam Vitals and nursing note reviewed.  Constitutional:      General: He is not in acute distress.    Appearance: He is well-developed and well-groomed.  HENT:     Head: Normocephalic and atraumatic.  Eyes:     Conjunctiva/sclera: Conjunctivae normal.  Neck:     Trachea: Trachea normal.  Cardiovascular:     Rate and Rhythm: Normal rate and regular rhythm.     Heart  sounds: No murmur heard. Pulmonary:     Effort: Pulmonary effort is normal. No respiratory distress.  Abdominal:     Palpations: Abdomen is soft.     Tenderness: There is no abdominal tenderness.  Musculoskeletal:        General: No swelling.     Cervical back: Neck supple. Torticollis present. Pain with movement and muscular tenderness present. No spinous process tenderness. Decreased range of motion.  Skin:    General: Skin is warm and dry.     Capillary Refill: Capillary refill takes less than 2 seconds.     Findings: No rash.  Neurological:     General: No focal deficit present.     Mental Status: He is alert and oriented to person, place, and time.     GCS: GCS eye subscore is 4. GCS verbal subscore is 5. GCS motor subscore is 6.     Cranial Nerves: No cranial nerve deficit.     Sensory: No sensory deficit.  Psychiatric:        Attention and Perception: Attention normal.        Mood and Affect: Mood normal.        Speech: Speech normal.        Behavior: Behavior normal. Behavior is cooperative.      UC Treatments / Results  Labs (all labs ordered are listed, but only abnormal results are displayed) Labs Reviewed - No data to display  EKG   Radiology No results found.  Procedures Procedures (including critical care time)  Medications Ordered in UC Medications - No data to display  Initial Impression / Assessment and Plan / UC Course  I have reviewed the triage vital signs and the nursing notes.  Pertinent labs & imaging results that were available during my care of the patient were reviewed by me and considered  in my medical decision making (see chart for details).    Discussed exam findings and plan of care with patient, strict go to ER precautions given.   Patient verbalized understanding to this provider.  Ddx: Torticollis, acute neck pain, muscle spasm, cervical strain Final Clinical Impressions(s) / UC Diagnoses   Final diagnoses:  Torticollis  Neck  pain     Discharge Instructions      Take home meds as directed. May use heat or ice to back for comfort. May use lidocaine patch or biofreeze for pain. Take muscle relaxer as prescribed, may cause drowsiness.  GO immediately to ER for loss of function, worsening pain, worst HA of life,etc.  Return as needed    ED Prescriptions     Medication Sig Dispense Auth. Provider   cyclobenzaprine (FLEXERIL) 5 MG tablet Take 1 tablet (5 mg total) by mouth 3 (three) times daily as needed for up to 5 days for muscle spasms. 15 tablet Skipper Dacosta, Para March, NP      PDMP not reviewed this encounter.   Clancy Gourd, NP 06/30/23 1020

## 2023-06-30 NOTE — ED Triage Notes (Signed)
Patient c/o upper back and neck pain that started Wed.  Patient states that the pain has gotten worse and know cannot move or turn his head.  Patient denies fall or injury.

## 2023-06-30 NOTE — Discharge Instructions (Addendum)
Take home meds as directed. May use heat or ice to back for comfort. May use lidocaine patch or biofreeze for pain. Take muscle relaxer as prescribed, may cause drowsiness.  GO immediately to ER for loss of function, worsening pain, worst HA of life,etc.  Return as needed
